# Patient Record
Sex: Male | Born: 1965 | Race: Black or African American | Hispanic: No | Marital: Married | Smoking: Never smoker
Health system: Southern US, Community
[De-identification: ages and names within clinical notes are randomized; demographics above are authoritative.]

## PROBLEM LIST (undated history)

## (undated) ENCOUNTER — Ambulatory Visit: Discharge status: Absent without leave | Payer: BLUE CROSS/BLUE SHIELD

## (undated) DIAGNOSIS — M1990 Unspecified osteoarthritis, unspecified site: Secondary | ICD-10-CM

## (undated) DIAGNOSIS — K219 Gastro-esophageal reflux disease without esophagitis: Secondary | ICD-10-CM

## (undated) DIAGNOSIS — R011 Cardiac murmur, unspecified: Secondary | ICD-10-CM

## (undated) DIAGNOSIS — S83209A Unspecified tear of unspecified meniscus, current injury, unspecified knee, initial encounter: Secondary | ICD-10-CM

## (undated) HISTORY — PX: TOE SURGERY: SHX1073

## (undated) HISTORY — DX: Cardiac murmur, unspecified: R01.1

## (undated) HISTORY — DX: Unspecified tear of unspecified meniscus, current injury, unspecified knee, initial encounter: S83.209A

## (undated) HISTORY — PX: SHOULDER SURGERY: SHX246

## (undated) HISTORY — PX: FACIAL LACERATION REPAIR: SHX6589

## (undated) HISTORY — PX: EXTERNAL EAR SURGERY: SHX627

---

## 1997-09-08 ENCOUNTER — Other Ambulatory Visit: Admission: RE | Admit: 1997-09-08 | Discharge: 1997-09-08 | Payer: Self-pay | Admitting: Obstetrics and Gynecology

## 2001-03-13 ENCOUNTER — Emergency Department (HOSPITAL_COMMUNITY): Admission: EM | Admit: 2001-03-13 | Discharge: 2001-03-13 | Payer: Self-pay

## 2004-05-19 ENCOUNTER — Emergency Department: Payer: Self-pay | Admitting: Emergency Medicine

## 2011-07-24 ENCOUNTER — Ambulatory Visit: Payer: Self-pay | Admitting: Family Medicine

## 2011-10-31 ENCOUNTER — Emergency Department (HOSPITAL_BASED_OUTPATIENT_CLINIC_OR_DEPARTMENT_OTHER): Payer: No Typology Code available for payment source

## 2011-10-31 ENCOUNTER — Emergency Department (HOSPITAL_BASED_OUTPATIENT_CLINIC_OR_DEPARTMENT_OTHER)
Admission: EM | Admit: 2011-10-31 | Discharge: 2011-10-31 | Disposition: A | Discharge status: Home / Self Care | Payer: No Typology Code available for payment source | Attending: Emergency Medicine | Admitting: Emergency Medicine

## 2011-10-31 ENCOUNTER — Encounter (HOSPITAL_BASED_OUTPATIENT_CLINIC_OR_DEPARTMENT_OTHER): Payer: Self-pay

## 2011-10-31 DIAGNOSIS — M542 Cervicalgia: Secondary | ICD-10-CM | POA: Insufficient documentation

## 2011-10-31 DIAGNOSIS — M549 Dorsalgia, unspecified: Secondary | ICD-10-CM | POA: Insufficient documentation

## 2011-10-31 MED ORDER — HYDROCODONE-ACETAMINOPHEN 5-500 MG PO TABS: 1.0000 | ORAL_TABLET | Freq: Four times a day (QID) | ORAL | Status: AC | PRN
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
## 2011-10-31 NOTE — Discharge Instructions (Signed)
Back Pain, Adult Low back pain is very common. About 1 in 5 people have back pain.The cause of low back pain is rarely dangerous. The pain often gets better over time.About half of people with a sudden onset of back pain feel better in just 2 weeks. About 8 in 10 people feel better by 6 weeks.  CAUSES Some common causes of back pain include:  Strain of the muscles or ligaments supporting the spine.   Wear and tear (degeneration) of the spinal discs.   Arthritis.   Direct injury to the back.  DIAGNOSIS Most of the time, the direct cause of low back pain is not known.However, back pain can be treated effectively even when the exact cause of the pain is unknown.Answering your caregiver's questions about your overall health and symptoms is one of the most accurate ways to make sure the cause of your pain is not dangerous. If your caregiver needs more information, he or she may order lab work or imaging tests (X-rays or MRIs).However, even if imaging tests show changes in your back, this usually does not require surgery. HOME CARE INSTRUCTIONS For many people, back pain returns.Since low back pain is rarely dangerous, it is often a condition that people can learn to manageon their own.   Remain active. It is stressful on the back to sit or stand in one place. Do not sit, drive, or stand in one place for more than 30 minutes at a time. Take short walks on level surfaces as soon as pain allows.Try to increase the length of time you walk each day.   Do not stay in bed.Resting more than 1 or 2 days can delay your recovery.   Do not avoid exercise or work.Your body is made to move.It is not dangerous to be active, even though your back may hurt.Your back will likely heal faster if you return to being active before your pain is gone.   Pay attention to your body when you bend and lift. Many people have less discomfortwhen lifting if they bend their knees, keep the load close to their  bodies,and avoid twisting. Often, the most comfortable positions are those that put less stress on your recovering back.   Find a comfortable position to sleep. Use a firm mattress and lie on your side with your knees slightly bent. If you lie on your back, put a pillow under your knees.   Only take over-the-counter or prescription medicines as directed by your caregiver. Over-the-counter medicines to reduce pain and inflammation are often the most helpful.Your caregiver may prescribe muscle relaxant drugs.These medicines help dull your pain so you can more quickly return to your normal activities and healthy exercise.   Put ice on the injured area.   Put ice in a plastic bag.   Place a towel between your skin and the bag.   Leave the ice on for 15 to 20 minutes, 3 to 4 times a day for the first 2 to 3 days. After that, ice and heat may be alternated to reduce pain and spasms.   Ask your caregiver about trying back exercises and gentle massage. This may be of some benefit.   Avoid feeling anxious or stressed.Stress increases muscle tension and can worsen back pain.It is important to recognize when you are anxious or stressed and learn ways to manage it.Exercise is a great option.  SEEK MEDICAL CARE IF:  You have pain that is not relieved with rest or medicine.   You have   pain that does not improve in 1 week.   You have new symptoms.   You are generally not feeling well.  SEEK IMMEDIATE MEDICAL CARE IF:   You have pain that radiates from your back into your legs.   You develop new bowel or bladder control problems.   You have unusual weakness or numbness in your arms or legs.   You develop nausea or vomiting.   You develop abdominal pain.   You feel faint.  Document Released: 05/15/2005 Document Revised: 05/04/2011 Document Reviewed: 10/03/2010 [employer] Patient Information 2012 [employer]. 
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
## 2011-11-06 NOTE — ED Provider Notes (Signed)
Medical screening examination/treatment/procedure(s) were performed by non-physician practitioner and as supervising physician I was immediately available for consultation/collaboration.    Tonyia Marschall L Zabria Liss, MD 11/06/11 1136 

## 2011-11-09 ENCOUNTER — Ambulatory Visit: Payer: Self-pay | Admitting: Family Medicine

## 2011-11-14 ENCOUNTER — Ambulatory Visit: Payer: Self-pay | Admitting: Family Medicine

## 2011-12-04 ENCOUNTER — Ambulatory Visit: Payer: No Typology Code available for payment source | Attending: Internal Medicine | Admitting: Physical Therapy

## 2011-12-04 DIAGNOSIS — IMO0001 Reserved for inherently not codable concepts without codable children: Secondary | ICD-10-CM | POA: Insufficient documentation

## 2011-12-04 DIAGNOSIS — M542 Cervicalgia: Secondary | ICD-10-CM | POA: Insufficient documentation

## 2011-12-04 DIAGNOSIS — M2569 Stiffness of other specified joint, not elsewhere classified: Secondary | ICD-10-CM | POA: Insufficient documentation

## 2011-12-04 DIAGNOSIS — M545 Low back pain, unspecified: Secondary | ICD-10-CM | POA: Insufficient documentation

## 2011-12-07 ENCOUNTER — Ambulatory Visit: Payer: No Typology Code available for payment source | Admitting: Physical Therapy

## 2011-12-11 ENCOUNTER — Encounter: Payer: No Typology Code available for payment source | Admitting: Physical Therapy

## 2011-12-13 ENCOUNTER — Ambulatory Visit: Payer: No Typology Code available for payment source | Admitting: Physical Therapy

## 2011-12-19 ENCOUNTER — Ambulatory Visit: Payer: No Typology Code available for payment source | Admitting: Physical Therapy

## 2011-12-21 ENCOUNTER — Ambulatory Visit: Payer: No Typology Code available for payment source | Admitting: Physical Therapy

## 2012-01-01 ENCOUNTER — Ambulatory Visit: Payer: No Typology Code available for payment source | Attending: Internal Medicine | Admitting: Physical Therapy

## 2012-01-01 DIAGNOSIS — M545 Low back pain, unspecified: Secondary | ICD-10-CM | POA: Insufficient documentation

## 2012-01-01 DIAGNOSIS — M542 Cervicalgia: Secondary | ICD-10-CM | POA: Insufficient documentation

## 2012-01-01 DIAGNOSIS — M2569 Stiffness of other specified joint, not elsewhere classified: Secondary | ICD-10-CM | POA: Insufficient documentation

## 2012-01-01 DIAGNOSIS — IMO0001 Reserved for inherently not codable concepts without codable children: Secondary | ICD-10-CM | POA: Insufficient documentation

## 2012-01-08 ENCOUNTER — Ambulatory Visit: Payer: No Typology Code available for payment source | Admitting: Physical Therapy

## 2012-01-10 ENCOUNTER — Ambulatory Visit: Payer: No Typology Code available for payment source | Admitting: Physical Therapy

## 2012-01-18 ENCOUNTER — Ambulatory Visit: Payer: No Typology Code available for payment source | Admitting: Physical Therapy

## 2014-08-26 ENCOUNTER — Ambulatory Visit
Admit: 2014-08-26 | Disposition: A | Discharge status: Home / Self Care | Payer: Self-pay | Attending: Family Medicine | Admitting: Family Medicine

## 2015-03-08 ENCOUNTER — Encounter (HOSPITAL_BASED_OUTPATIENT_CLINIC_OR_DEPARTMENT_OTHER): Payer: Self-pay | Admitting: *Deleted

## 2015-03-08 ENCOUNTER — Emergency Department (HOSPITAL_BASED_OUTPATIENT_CLINIC_OR_DEPARTMENT_OTHER)
Admission: EM | Admit: 2015-03-08 | Discharge: 2015-03-09 | Disposition: A | Discharge status: Home / Self Care | Payer: Managed Care, Other (non HMO) | Attending: Emergency Medicine | Admitting: Emergency Medicine

## 2015-03-08 ENCOUNTER — Emergency Department (HOSPITAL_BASED_OUTPATIENT_CLINIC_OR_DEPARTMENT_OTHER): Payer: Managed Care, Other (non HMO)

## 2015-03-08 DIAGNOSIS — S138XXA Sprain of joints and ligaments of other parts of neck, initial encounter: Secondary | ICD-10-CM | POA: Diagnosis not present

## 2015-03-08 DIAGNOSIS — S4992XA Unspecified injury of left shoulder and upper arm, initial encounter: Secondary | ICD-10-CM | POA: Insufficient documentation

## 2015-03-08 DIAGNOSIS — Y92410 Unspecified street and highway as the place of occurrence of the external cause: Secondary | ICD-10-CM | POA: Diagnosis not present

## 2015-03-08 DIAGNOSIS — S0990XA Unspecified injury of head, initial encounter: Secondary | ICD-10-CM | POA: Diagnosis not present

## 2015-03-08 DIAGNOSIS — Y998 Other external cause status: Secondary | ICD-10-CM | POA: Insufficient documentation

## 2015-03-08 DIAGNOSIS — S3991XA Unspecified injury of abdomen, initial encounter: Secondary | ICD-10-CM | POA: Diagnosis not present

## 2015-03-08 DIAGNOSIS — S199XXA Unspecified injury of neck, initial encounter: Secondary | ICD-10-CM | POA: Diagnosis present

## 2015-03-08 DIAGNOSIS — S3992XA Unspecified injury of lower back, initial encounter: Secondary | ICD-10-CM | POA: Diagnosis not present

## 2015-03-08 DIAGNOSIS — Y9389 Activity, other specified: Secondary | ICD-10-CM | POA: Diagnosis not present

## 2015-03-08 DIAGNOSIS — S139XXA Sprain of joints and ligaments of unspecified parts of neck, initial encounter: Secondary | ICD-10-CM

## 2015-03-08 LAB — CBC WITH DIFFERENTIAL/PLATELET
BASOS ABS: 0 10*3/uL (ref 0.0–0.1)
BASOS PCT: 0 %
EOS PCT: 5 %
Eosinophils Absolute: 0.2 10*3/uL (ref 0.0–0.7)
HCT: 41.3 % (ref 39.0–52.0)
Hemoglobin: 13.4 g/dL (ref 13.0–17.0)
Lymphocytes Relative: 36 %
Lymphs Abs: 1.7 10*3/uL (ref 0.7–4.0)
MCH: 28.4 pg (ref 26.0–34.0)
MCHC: 32.4 g/dL (ref 30.0–36.0)
MCV: 87.5 fL (ref 78.0–100.0)
MONO ABS: 0.3 10*3/uL (ref 0.1–1.0)
Monocytes Relative: 7 %
Neutro Abs: 2.5 10*3/uL (ref 1.7–7.7)
Neutrophils Relative %: 52 %
PLATELETS: 214 10*3/uL (ref 150–400)
RBC: 4.72 MIL/uL (ref 4.22–5.81)
RDW: 13 % (ref 11.5–15.5)
WBC: 4.7 10*3/uL (ref 4.0–10.5)

## 2015-03-08 LAB — COMPREHENSIVE METABOLIC PANEL
ALBUMIN: 4 g/dL (ref 3.5–5.0)
ALT: 14 U/L — ABNORMAL LOW (ref 17–63)
AST: 22 U/L (ref 15–41)
Alkaline Phosphatase: 69 U/L (ref 38–126)
Anion gap: 3 — ABNORMAL LOW (ref 5–15)
BUN: 14 mg/dL (ref 6–20)
CHLORIDE: 104 mmol/L (ref 101–111)
CO2: 30 mmol/L (ref 22–32)
Calcium: 9 mg/dL (ref 8.9–10.3)
Creatinine, Ser: 1.03 mg/dL (ref 0.61–1.24)
GFR calc Af Amer: >60 mL/min (ref >60)
Glucose, Bld: 110 mg/dL — ABNORMAL HIGH (ref 65–99)
POTASSIUM: 3.6 mmol/L (ref 3.5–5.1)
Sodium: 137 mmol/L (ref 135–145)
Total Bilirubin: 2.4 mg/dL — ABNORMAL HIGH (ref 0.3–1.2)
Total Protein: 6.7 g/dL (ref 6.5–8.1)

## 2015-03-08 MED ORDER — DIAZEPAM 5 MG PO TABS
5.0000 mg | ORAL_TABLET | Freq: Once | ORAL | Status: AC
  Administered 2015-03-09: 5 mg via ORAL
  Filled 2015-03-08: qty 1

## 2015-03-08 MED ORDER — SODIUM CHLORIDE 0.9 % IV BOLUS (SEPSIS)
1000.0000 mL | Freq: Once | INTRAVENOUS | Status: AC
  Administered 2015-03-09: 1000 mL via INTRAVENOUS

## 2015-03-08 MED ORDER — IBUPROFEN 800 MG PO TABS
800.0000 mg | ORAL_TABLET | Freq: Once | ORAL | Status: AC
  Administered 2015-03-09: 800 mg via ORAL
  Filled 2015-03-08: qty 1

## 2015-03-08 MED ORDER — SODIUM CHLORIDE 0.9 % IV SOLN: Freq: Once | INTRAVENOUS | Status: DC

## 2015-03-08 MED ORDER — IOHEXOL 300 MG/ML  SOLN
100.0000 mL | Freq: Once | INTRAMUSCULAR | Status: AC | PRN
  Administered 2015-03-08: 100 mL via INTRAVENOUS
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
## 2015-03-09 MED ORDER — DIAZEPAM 5 MG PO TABS: 5.0000 mg | ORAL_TABLET | Freq: Four times a day (QID) | ORAL | Status: DC | PRN
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
## 2015-03-30 ENCOUNTER — Encounter: Payer: Self-pay | Admitting: Physical Therapy

## 2015-03-30 ENCOUNTER — Ambulatory Visit: Payer: Self-pay | Attending: Internal Medicine | Admitting: Physical Therapy

## 2015-03-30 DIAGNOSIS — M545 Low back pain, unspecified: Secondary | ICD-10-CM

## 2015-03-30 DIAGNOSIS — M546 Pain in thoracic spine: Secondary | ICD-10-CM | POA: Insufficient documentation

## 2015-03-30 DIAGNOSIS — M25612 Stiffness of left shoulder, not elsewhere classified: Secondary | ICD-10-CM | POA: Insufficient documentation
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
## 2015-03-30 NOTE — Patient Instructions (Signed)
AROM: Lateral Neck Flexion   Slowly tilt head toward one shoulder, then the other. Hold each position _3__ seconds. Repeat __10__ times per set. Do _2___ sets per session. Do _2___ sessions per day.  AROM: Neck Flexion   Bend head forward. Hold _3___ seconds. Repeat _10___ times per set. Do _2___ sets per session. Do _2___ sessions per day.  Levator Stretch   Grasp seat or sit on hand on side to be stretched. Turn head toward other side and look down. Use hand on head to gently stretch neck in that position. Hold _20___ seconds. Repeat on other side. Repeat __3__ times. Do __2__ sessions per day.  Elevation / Depression   While slowly inhaling, bring shoulders toward ears. Hold _2__ seconds. Slowly exhale while relaxing shoulders backward and downward. Repeat _10__ times. Do _2__ times per day.SCAPULA: Retraction   Hold cane with both hands. Pinch shoulder blades together. Do not shrug shoulders. Hold ___ seconds. Use___ lb weight on cane. ___ reps per set, ___ sets per day, ___ days per week  NECK TENSION: Assisted Stretch   Reach right arm around head and hold slightly above ear. Gently bring right ear toward right shoulder. Hold position for ___ breaths. Repeat with other arm. Repeat ___ times, alternating arms. Do ___ times per day.  Flexibility: Neck Retraction   Pull head straight back, keeping eyes and jaw level. Repeat ____ times per set. Do ____ sets per session. Do ____ sessions per day.   Knee-to-Chest: with Neck Flexion Stretch (Supine)   Pull left knee to chest, tucking chin and lifting head. Hold __10__ seconds. Relax. Repeat _10___ times per set. Do _2___ sets per session. Do __2__ sessions per day.  Trunk: Knees to Chest   Lie on firm, flat surface. Keep head and shoulders flat on surface. Tuck hands behind knees and pull to chest. Hold _10___ seconds. Repeat __10__ times. Do _2___ sessions per day. CAUTION: Movement should be gentle and  slow.  Caudal Rotation: Hip Roll, Neutral Lordosis - Supine   Lie with knees bent and slightly elevated, feet flat. Tighten stomach, lower knees out to right side, rotating hips and trunk. Keep stomach tight for return. Repeat _10___ times per set. Do __2__ sets per session. Do _2___ sessions per week.  Pelvic Tilt: Anterior - Legs Bent (Supine)   Rotate pelvis up and arch back. Hold ____ seconds. Relax. Repeat ____ times per set. Do ____ sets per session. Do ____ sessions per day.  Piriformis Stretch   Lying on back, pull right knee toward opposite shoulder. Hold __30__ seconds. Repeat _4___ times. Do _2___ sessions per day.  

## 2015-04-01 ENCOUNTER — Ambulatory Visit: Payer: Self-pay | Admitting: Physical Therapy

## 2015-04-01 ENCOUNTER — Encounter: Payer: Self-pay | Admitting: Physical Therapy

## 2015-04-01 DIAGNOSIS — M25612 Stiffness of left shoulder, not elsewhere classified: Secondary | ICD-10-CM

## 2015-04-01 DIAGNOSIS — M545 Low back pain, unspecified: Secondary | ICD-10-CM

## 2015-04-01 DIAGNOSIS — M546 Pain in thoracic spine: Secondary | ICD-10-CM

## 2015-04-01 NOTE — Therapy (Signed)
[facility] [facility] [address] [place], [place], [address] Phone: [phone]   Fax:  [phone]  Physical Therapy Treatment  Patient Details  Name: Kenneth Bullock MRN: [id number] Date of Birth: 02-22-1966 Referring Provider: Kirby Funk  Encounter Date: 04/01/2015      PT End of Session - 04/01/15 1648    Visit Number 2   PT Start Time 1601   PT Stop Time 1655   PT Time Calculation (min) 54 min      History reviewed. No pertinent past medical history.  Past Surgical History  Procedure Laterality Date  . Toe surgery      There were no vitals filed for this visit.  Visit Diagnosis:  Bilateral low back pain without sciatica  Bilateral thoracic back pain  Shoulder stiffness, left      Subjective Assessment - 04/01/15 1605    Subjective Pt reports that his back and shoulder pain have increased a little since last visit. He has really been dealing with a lot of spasms in his low back pain, and pain in the joint at the shoulder.   Pain Score 7    Pain Location Back   Pain Orientation Left;Lower   Pain Descriptors / Indicators Aching;Spasm                         [facility] Adult PT Treatment/Exercise - 04/01/15 0001    Exercises   Exercises Shoulder;Lumbar   Lumbar Exercises: Machines for Strengthening   Leg Press 40# 2x 10   Other Lumbar Machine Exercise 10# Extension and abduction  2x 10 Both LE's   Lumbar Exercises: Seated   Other Seated Lumbar Exercises dyna disc lumbar CC, CCW,  flexion and extension    Shoulder Exercises: ROM/Strengthening   Cybex Row 10 reps   Cybex Row Limitations 25# 2x 10, Lat pull downs 25# 2x10   Modalities   Modalities Moist Heat;Electrical Stimulation   Moist Heat Therapy   Number Minutes Moist Heat 15 Minutes   Moist Heat Location Lumbar Spine;Shoulder   Electrical Stimulation   Electrical Stimulation Location Lumbar and Shoulder   Electrical Stimulation  Action Premod   Electrical Stimulation Parameters tolerance    Electrical Stimulation Goals Pain                PT Education - 04/01/15 1647    Education provided Yes   Education Details Importance of stretching    Person(s) Educated Patient   Methods Explanation   Comprehension Verbalized understanding          PT Short Term Goals - 03/30/15 1548    PT SHORT TERM GOAL #1   Title independent with initial HEP   Time 2   Period Weeks   Status New           PT Long Term Goals - 03/30/15 1548    PT LONG TERM GOAL #1   Title understand proper posture and body mechanics   Time 8   Period Weeks   Status New   PT LONG TERM GOAL #2   Title decrease pain 50%   Time 8   Period Weeks   Status New   PT LONG TERM GOAL #3   Title increase lumbar ROM 50%   Time 8   Period Weeks   Status New   PT LONG TERM GOAL #4   Title increase left shoulder ROM to 140 degrees flexion  Time 8   Period Weeks   Status New               Plan - 04/01/15 1650    Clinical Impression Statement Pt able to tolerate standing hip exercises and lumbar stabilization with c/o of slight increase in pain. Attempted pre-mod E-stim to shoulder and low back, because shoulder pain seemed to be most bothersome to pt.   PT Next Visit Plan Slowly add more lumbar and scapular strengthening exercises.         Problem List There are no active problems to display for this patient.   Meyer RusselMaegan Chavez Rosol, SPTA 04/01/2015, 4:55 PM  [facility] [address] [place], [place], [address] Phone: [phone]   Fax:  [phone]  Name: Kenneth Bullock MRN: [id number] Date of Birth: [birth year]

## 2015-04-05 ENCOUNTER — Ambulatory Visit: Payer: Self-pay | Admitting: Physical Therapy

## 2015-04-05 ENCOUNTER — Encounter: Payer: Self-pay | Admitting: Physical Therapy

## 2015-04-05 DIAGNOSIS — M25612 Stiffness of left shoulder, not elsewhere classified: Secondary | ICD-10-CM

## 2015-04-05 DIAGNOSIS — M546 Pain in thoracic spine: Secondary | ICD-10-CM

## 2015-04-05 DIAGNOSIS — M545 Low back pain, unspecified: Secondary | ICD-10-CM

## 2015-04-05 NOTE — Therapy (Signed)
[address] [address] [place], [place], [address] Phone: [phone]   Fax:  [phone]  Physical Therapy Treatment  Patient Details  Name: Kenneth Bullock MRN: [id number] Date of Birth: 26-Sep-1965 Referring Provider: Lavone Orn  Encounter Date: 04/05/2015      PT End of Session - 04/05/15 1645    Visit Number 3   Date for PT Re-Evaluation 05/30/15   PT Start Time 3710   PT Stop Time 1659   PT Time Calculation (min) 61 min   Activity Tolerance Patient tolerated treatment well   Behavior During Therapy [facility] for tasks assessed/performed      History reviewed. No pertinent past medical history.  Past Surgical History  Procedure Laterality Date  . Toe surgery      There were no vitals filed for this visit.  Visit Diagnosis:  Shoulder stiffness, left  Bilateral thoracic back pain  Bilateral low back pain without sciatica      Subjective Assessment - 04/05/15 1601    Subjective Pt reports that he continues to have spasms in the back and L shoulder   Diagnostic tests x-rays negative   Currently in Pain? Yes   Pain Score 5    Pain Location Back   Pain Orientation Lower;Mid   Pain Descriptors / Indicators Aching;Spasm                         [facility] Adult PT Treatment/Exercise - 04/05/15 0001    Lumbar Exercises: Stretches   Passive Hamstring Stretch 4 reps;10 seconds   Single Knee to Chest Stretch 1 rep;10 seconds   Double Knee to Chest Stretch 2 reps;10 seconds   Lower Trunk Rotation 2 reps;10 seconds   Lumbar Exercises: Aerobic   Elliptical I 10 R 5 23fd/2rev   UBE (Upper Arm Bike) NuStep L5 x8   Lumbar Exercises: Machines for Strengthening   Cybex Knee Extension #25 2x15    Cybex Knee Flexion #35 2x15    Leg Press 50# 2x 15   Lumbar Exercises: Standing   Other Standing Lumbar Exercises Standing straight arm pull downs #35 2x15    Lumbar Exercises: Seated   Sit to Stand 10  reps  2 sets, OHP with blue weighted ball    Other Seated Lumbar Exercises Seated obliques blue weighted ball LE elevated 2x10    Lumbar Exercises: Supine   Bridge 2 seconds;20 reps  ab set    Shoulder Exercises: Seated   Row 15 reps  2 sets    Row Weight (lbs) 45lb   Other Seated Exercises Seated Lat pull downs 2x15 #35    Modalities   Modalities Moist Heat;Electrical Stimulation   Moist Heat Therapy   Number Minutes Moist Heat 15 Minutes   Moist Heat Location Lumbar Spine;Shoulder   Electrical Stimulation   Electrical Stimulation Location Lumbar and Shoulder   Electrical Stimulation Action Premod    Electrical Stimulation Parameters tolerance    Electrical Stimulation Goals Pain                  PT Short Term Goals - 04/05/15 1648    PT SHORT TERM GOAL #1   Title independent with initial HEP   Status Achieved           PT Long Term Goals - 04/05/15 1649    PT LONG TERM GOAL #1   Title understand proper posture and body mechanics   PT  LONG TERM GOAL #2   Title decrease pain 50%   Status Partially Met               Plan - 04/05/15 1647    Clinical Impression Statement Pt able to progress with interventions with increased weight/resistance. Able to perform more core interventions with only slight increase in LBP. Pt does report improvement overall.   Pt will benefit from skilled therapeutic intervention in order to improve on the following deficits Decreased strength;Decreased range of motion;Impaired flexibility;Increased muscle spasms;Pain   Rehab Potential Good   PT Frequency 2x / week   PT Duration 8 weeks   PT Treatment/Interventions Moist Heat;Electrical Stimulation;ADLs/Self Care Home Management;Therapeutic activities;Therapeutic exercise;Manual techniques;Patient/family education   PT Next Visit Plan Slowly add more lumbar and scapular strengthening exercises.         Problem List There are no active problems to display for this  patient.   Scot Jun, PTA  04/05/2015, 4:49 PM  [place], [place], [address] Phone: [phone]   Fax:  [phone]  Name: Kenneth Bullock MRN: [id number] Date of Birth: 16-Nov-1965

## 2015-04-08 ENCOUNTER — Encounter: Payer: Self-pay | Admitting: Physical Therapy

## 2015-04-08 ENCOUNTER — Ambulatory Visit: Payer: Self-pay | Admitting: Physical Therapy

## 2015-04-08 DIAGNOSIS — M546 Pain in thoracic spine: Secondary | ICD-10-CM

## 2015-04-08 DIAGNOSIS — M545 Low back pain, unspecified: Secondary | ICD-10-CM

## 2015-04-08 DIAGNOSIS — M25612 Stiffness of left shoulder, not elsewhere classified: Secondary | ICD-10-CM

## 2015-04-08 NOTE — Therapy (Signed)
[place] [address] [place], [place], [address] Phone: [phone]   Fax:  [phone]  Physical Therapy Treatment  Patient Details  Name: Kenneth Bullock MRN: [id number] Date of Birth: 04-17-1966 Referring Provider: Lavone Orn  Encounter Date: 04/08/2015      PT End of Session - 04/08/15 1644    Visit Number 4   Date for PT Re-Evaluation 05/30/15   PT Start Time 1603   PT Stop Time 1642   PT Time Calculation (min) 39 min      History reviewed. No pertinent past medical history.  Past Surgical History  Procedure Laterality Date  . Toe surgery      There were no vitals filed for this visit.  Visit Diagnosis:  Bilateral thoracic back pain  Bilateral low back pain without sciatica  Shoulder stiffness, left      Subjective Assessment - 04/08/15 1602    Subjective Pt reports some workout soreness after last treatment. Shoulder feel the same but his back feels better   Currently in Pain? Yes   Pain Score 5    Pain Location Back   Pain Orientation Lower;Mid                         [facility] Adult PT Treatment/Exercise - 04/08/15 0001    Lumbar Exercises: Aerobic   UBE (Upper Arm Bike) NuStep L5 x8   Lumbar Exercises: Machines for Strengthening   Cybex Knee Extension #25 2x15    Cybex Knee Flexion #35 2x15    Leg Press 60# 2x 15   Other Lumbar Machine Exercise 10# Extension and abduction  x10 Both LE's   Lumbar Exercises: Standing   Other Standing Lumbar Exercises Standing straight arm pull downs #45 2x15, Standing rev grip rows #45 2x15    Other Standing Lumbar Exercises Standinmg AR press #25 x10   Lumbar Exercises: Seated   Sit to Stand 10 reps  2 sets, OHP with blue weighted ball.    Other Seated Lumbar Exercises Seated obliques blue weighted ball LE elevateed 2x10    Shoulder Exercises: Seated   Row 15 reps  2 sets    Row Weight (lbs) 45lb   Other Seated Exercises Seated  Lat pull downs 2x15 #35                   PT Short Term Goals - 04/05/15 1648    PT SHORT TERM GOAL #1   Title independent with initial HEP   Status Achieved           PT Long Term Goals - 04/05/15 1649    PT LONG TERM GOAL #1   Title understand proper posture and body mechanics   PT LONG TERM GOAL #2   Title decrease pain 50%   Status Partially Met               Plan - 04/08/15 1645    Clinical Impression Statement Pt performed well in PT this date able to complete all interventions and shows good strength. Does have a mid back spasm with standing AR press intervention. Reports that his back has improved but he still has shoulder pain.    Pt will benefit from skilled therapeutic intervention in order to improve on the following deficits Decreased strength;Decreased range of motion;Impaired flexibility;Increased muscle spasms;Pain   Rehab Potential Good   PT Frequency 2x / week   PT Duration 8  weeks   PT Treatment/Interventions Moist Heat;Electrical Stimulation;ADLs/Self Care Home Management;Therapeutic activities;Therapeutic exercise;Manual techniques;Patient/family education   PT Next Visit Plan Core strengthening         Problem List There are no active problems to display for this patient.   Scot Jun, PTA 04/08/2015, 4:49 PM  [place] [address] [place], [place], [address] Phone: [phone]   Fax:  [phone]  Name: Kenneth Bullock MRN: [id number] Date of Birth: Oct 07, 1965

## 2015-04-12 ENCOUNTER — Ambulatory Visit: Payer: Self-pay | Admitting: Physical Therapy

## 2015-04-12 ENCOUNTER — Encounter: Payer: Self-pay | Admitting: Physical Therapy

## 2015-04-12 DIAGNOSIS — M546 Pain in thoracic spine: Secondary | ICD-10-CM

## 2015-04-12 DIAGNOSIS — M545 Low back pain, unspecified: Secondary | ICD-10-CM

## 2015-04-12 DIAGNOSIS — M25612 Stiffness of left shoulder, not elsewhere classified: Secondary | ICD-10-CM

## 2015-04-12 NOTE — Therapy (Signed)
[address] [place], [place], [address] Phone: [phone]   Fax:  [phone]  Physical Therapy Treatment  Patient Details  Name: Kenneth Bullock MRN: [id number] Date of Birth: 06-Jul-1965 Referring Provider: Lavone Orn  Encounter Date: 04/12/2015      PT End of Session - 04/12/15 1652    Visit Number 5   Date for PT Re-Evaluation 05/30/15   PT Start Time 6962   PT Stop Time 1703   PT Time Calculation (min) 50 min      History reviewed. No pertinent past medical history.  Past Surgical History  Procedure Laterality Date  . Toe surgery      There were no vitals filed for this visit.  Visit Diagnosis:  Bilateral thoracic back pain  Bilateral low back pain without sciatica  Shoulder stiffness, left      Subjective Assessment - 04/12/15 1614    Subjective Pt reports back pain is decreasing but shoulder pain remains constant. Pt states he feels he is getting stronger and slowly improving.    Currently in Pain? Yes   Pain Score 4    Pain Location Back   Pain Orientation Lower;Mid   Pain Type Acute pain   Pain Onset 1 to 4 weeks ago                         [facility] Adult PT Treatment/Exercise - 04/12/15 0001    Lumbar Exercises: Aerobic   Elliptical I 10 R 5 80frd/2rev   UBE (Upper Arm Bike) NuStep L5 x8   Lumbar Exercises: Machines for Strengthening   Cybex Knee Extension #25 2x15    Cybex Knee Flexion #35 2x15    Leg Press 60# 2x 15   Other Lumbar Machine Exercise 10# Extension and abduction  x10 Both LE's   Lumbar Exercises: Standing   Other Standing Lumbar Exercises Standing straigh arm pull downs #45 2x15, Standing rev grip rowx #45 2x15    Other Standing Lumbar Exercises Standinmg AR press #25 x10   Modalities   Modalities Moist Heat;Electrical Stimulation   Moist Heat Therapy   Number Minutes Moist Heat 15 Minutes   Moist Heat Location Lumbar Spine;Shoulder   Electrical Stimulation   Electrical Stimulation Location Lumbar and Shoulder   Electrical Stimulation Action Premod   Electrical Stimulation Parameters tolerance   Electrical Stimulation Goals Pain                  PT Short Term Goals - 04/05/15 1648    PT SHORT TERM GOAL #1   Title independent with initial HEP   Status Achieved           PT Long Term Goals - 04/05/15 1649    PT LONG TERM GOAL #1   Title understand proper posture and body mechanics   PT LONG TERM GOAL #2   Title decrease pain 50%   Status Partially Met               Plan - 04/12/15 1653    Clinical Impression Statement Pt arrived almost 15 minutes late to therapy. Pt complains of mild spasms with activities that require lumbar rotation. Complains of pain in shoulder with lifting activities. Strength continues to improve, reports of pain steadilly decreasing.    PT Next Visit Plan Work to add core strengthening and shoulder resistance exercises.         Problem List There  are no active problems to display for this patient.   Crist Fat, SPTA 04/12/2015, 4:56 PM  [place] [address] [place], [place], [address] Phone: [phone]   Fax:  [phone]  Name: Kenneth Bullock MRN: [id number] Date of Birth: [birth year]

## 2015-04-15 ENCOUNTER — Ambulatory Visit: Payer: Self-pay | Admitting: Physical Therapy

## 2015-04-20 ENCOUNTER — Ambulatory Visit: Payer: Self-pay | Admitting: Physical Therapy

## 2015-04-20 ENCOUNTER — Encounter: Payer: Self-pay | Admitting: Physical Therapy

## 2015-04-20 DIAGNOSIS — M25612 Stiffness of left shoulder, not elsewhere classified: Secondary | ICD-10-CM

## 2015-04-20 DIAGNOSIS — M545 Low back pain, unspecified: Secondary | ICD-10-CM

## 2015-04-20 DIAGNOSIS — M546 Pain in thoracic spine: Secondary | ICD-10-CM

## 2015-04-20 NOTE — Therapy (Signed)
[address] [place], [place], [address] Phone: [phone]   Fax:  [phone]  Physical Therapy Treatment  Patient Details  Name: Kenneth Bullock MRN: [id number] Date of Birth: September 12, 1965 Referring Provider: Lavone Orn  Encounter Date: 04/20/2015      PT End of Session - 04/20/15 1643    Visit Number 6   Date for PT Re-Evaluation 05/30/15   PT Start Time 1602   PT Stop Time 1642   PT Time Calculation (min) 40 min      History reviewed. No pertinent past medical history.  Past Surgical History  Procedure Laterality Date  . Toe surgery      There were no vitals filed for this visit.  Visit Diagnosis:  Bilateral low back pain without sciatica  Shoulder stiffness, left  Bilateral thoracic back pain      Subjective Assessment - 04/20/15 1608    Subjective Pt reports that his shoulder is still weak, but ROM is better. Continues to have low back spasms    Currently in Pain? No/denies   Pain Score 4    Pain Location Back  Shoulder 6/10            [facility] PT Assessment - 04/20/15 0001    AROM   Overall AROM Comments Lumbar ROM WNL                     [facility] Adult PT Treatment/Exercise - 04/20/15 0001    Lumbar Exercises: Aerobic   Elliptical I 15 R 10 82frd/3rev   Lumbar Exercises: Machines for Strengthening   Cybex Knee Extension #35 2x15    Cybex Knee Flexion #45 2x15    Leg Press 70# 2x 15   Lumbar Exercises: Standing   Other Standing Lumbar Exercises Standing straight arm pull downs #45 2x15, Standing rev grip rows #45 2x15    Other Standing Lumbar Exercises Standing AR press #35 x10, RDL #9 2x10; Resisted side step and squat blue tband frd reach blue weights ball 3x5    Shoulder Exercises: Seated   Other Seated Exercises Seated Lat pull downs 2x15 #35                   PT Short Term Goals - 04/05/15 1648    PT SHORT TERM GOAL #1   Title independent  with initial HEP   Status Achieved           PT Long Term Goals - 04/20/15 1646    PT LONG TERM GOAL #1   Title understand proper posture and body mechanics   Status Partially Met   PT LONG TERM GOAL #3   Title increase lumbar ROM 50%   Status Achieved               Plan - 04/20/15 1645    Clinical Impression Statement Pt performed well in therapy this date. Reports low back spasms at time with activity. Shoulder ROM has improved with more function. Tolerated multi plan interventions this date.    Pt will benefit from skilled therapeutic intervention in order to improve on the following deficits Decreased strength;Decreased range of motion;Impaired flexibility;Increased muscle spasms;Pain   PT Frequency 2x / week   PT Duration 8 weeks   PT Treatment/Interventions Moist Heat;Electrical Stimulation;ADLs/Self Care Home Management;Therapeutic activities;Therapeutic exercise;Manual techniques;Patient/family education   PT Next Visit Plan Planks         Problem List There are no  active problems to display for this patient.   Scot Jun, PTA  04/20/2015, 4:47 PM  [place] [address] [place], [place], [address] Phone: [phone]   Fax:  [phone]  Name: Kenneth Bullock MRN: [id number] Date of Birth: June 15, 1965

## 2015-04-26 ENCOUNTER — Encounter: Payer: Self-pay | Admitting: Physical Therapy

## 2015-04-26 ENCOUNTER — Ambulatory Visit: Payer: Self-pay | Admitting: Physical Therapy

## 2015-04-26 DIAGNOSIS — M545 Low back pain, unspecified: Secondary | ICD-10-CM

## 2015-04-26 DIAGNOSIS — M546 Pain in thoracic spine: Secondary | ICD-10-CM

## 2015-04-26 DIAGNOSIS — M25612 Stiffness of left shoulder, not elsewhere classified: Secondary | ICD-10-CM

## 2015-04-26 NOTE — Therapy (Signed)
[address] [address] [place], [place], [address] Phone: [phone]   Fax:  [phone]  Physical Therapy Treatment  Patient Details  Name: Kenneth Bullock MRN: [id number] Date of Birth: 30-Dec-1965 Referring Provider: Lavone Orn  Encounter Date: 04/26/2015      PT End of Session - 04/26/15 1654    Visit Number 7   Date for PT Re-Evaluation 05/30/15   PT Start Time 1623   PT Stop Time 1705   PT Time Calculation (min) 42 min      History reviewed. No pertinent past medical history.  Past Surgical History  Procedure Laterality Date  . Toe surgery      There were no vitals filed for this visit.  Visit Diagnosis:  Bilateral low back pain without sciatica  Shoulder stiffness, left  Bilateral thoracic back pain      Subjective Assessment - 04/26/15 1625    Subjective Pt reports back has been feeling better overall, spaspms are less frequent. Reports work activities irritate it most.    Currently in Pain? Yes   Pain Score 3    Pain Location Back   Pain Orientation Lower   Pain Descriptors / Indicators Aching;Spasm   Pain Type Acute pain   Pain Onset 1 to 4 weeks ago   Pain Frequency Intermittent   Aggravating Factors  lifting at work    Pain Relieving Factors exercise and activity                          [facility] Adult PT Treatment/Exercise - 04/26/15 0001    Lumbar Exercises: Stretches   Press Ups 2 reps;30 seconds   Lumbar Exercises: Standing   Other Standing Lumbar Exercises standing lumbar extension 35# 2x10   Lumbar Exercises: Supine   Bridge 10 reps;2 seconds  2 sets    Other Supine Lumbar Exercises bridge with ball roll, and UE weight red ball    Lumbar Exercises: Prone   Other Prone Lumbar Exercises plank 15 sec x 5    Shoulder Exercises: Seated   Row 15 reps  2 sets    Row Weight (lbs) 45lb   Other Seated Exercises Seated Lat pull downs 2x15 #35    Modalities   Modalities Moist Heat;Electrical Stimulation   Moist Heat Therapy   Number Minutes Moist Heat 15 Minutes   Moist Heat Location Lumbar Spine;Shoulder   Electrical Stimulation   Electrical Stimulation Location Lumbar and Shoulder   Electrical Stimulation Action Premod   Electrical Stimulation Parameters tolerance   Electrical Stimulation Goals Pain                PT Education - 04/26/15 1654    Education Details Educated pt on HEP compliance    Person(s) Educated Patient   Methods Explanation   Comprehension Verbalized understanding          PT Short Term Goals - 04/05/15 1648    PT SHORT TERM GOAL #1   Title independent with initial HEP   Status Achieved           PT Long Term Goals - 04/20/15 1646    PT LONG TERM GOAL #1   Title understand proper posture and body mechanics   Status Partially Met   PT LONG TERM GOAL #3   Title increase lumbar ROM 50%   Status Achieved  Plan - 04/26/15 1655    Clinical Impression Statement Pt arrived late for therapy on this date. Able to complete all abdominal, oblique and lumbar stabilization exercises without report of increased pain. Pt reported non compliance with HEP  exercises and stretches, emphasized importance to pt.    PT Next Visit Plan Work on lumbar stabilization and core exercises.         Problem List There are no active problems to display for this patient.   Crist Fat, SPTA 04/26/2015, 4:57 PM  [place] [address] [place], [place], [address] Phone: [phone]   Fax:  [phone]  Name: Kenneth Bullock MRN: [id number] Date of Birth: 1965-06-01

## 2015-04-29 ENCOUNTER — Encounter: Payer: Self-pay | Admitting: Physical Therapy

## 2015-04-29 ENCOUNTER — Ambulatory Visit: Payer: Self-pay | Attending: Internal Medicine | Admitting: Physical Therapy

## 2015-04-29 DIAGNOSIS — M25612 Stiffness of left shoulder, not elsewhere classified: Secondary | ICD-10-CM

## 2015-04-29 DIAGNOSIS — M545 Low back pain, unspecified: Secondary | ICD-10-CM

## 2015-04-29 DIAGNOSIS — M546 Pain in thoracic spine: Secondary | ICD-10-CM

## 2015-04-29 NOTE — Therapy (Signed)
[address] [place], [place], [address] Phone: [phone]   Fax:  [phone]  Physical Therapy Treatment  Patient Details  Name: Kenneth Bullock MRN: [id number] Date of Birth: 1966-01-03 Referring Provider: Lavone Orn  Encounter Date: 04/29/2015      PT End of Session - 04/29/15 1614    Visit Number 8   Date for PT Re-Evaluation 05/30/15   PT Start Time 9449   PT Stop Time 1630   PT Time Calculation (min) 51 min      History reviewed. No pertinent past medical history.  Past Surgical History  Procedure Laterality Date  . Toe surgery      There were no vitals filed for this visit.  Visit Diagnosis:  Bilateral low back pain without sciatica  Shoulder stiffness, left  Bilateral thoracic back pain      Subjective Assessment - 04/29/15 1630    Subjective -- Pt reports he has not had any spasms since his last visit. He reports a strong catch in his right low back that sends a shooting pain down his right buttocks.    Currently in Pain? -- Yes    Pain Score --3   Pain Location -- Back, Lower, Right    Pain Orientation --   Pain Type --Shooting , Stabbing    Pain Onset --1 to 4 weeks    Pain Frequency --                          [facility] Adult PT Treatment/Exercise - 04/29/15 0001    Lumbar Exercises: Aerobic   Elliptical I 15 R 10 62fd/3rev   Lumbar Exercises: Seated   Other Seated Lumbar Exercises Seated obliques blue weighted ball LE elevateed 2x10    Lumbar Exercises: Supine   Bridge 10 reps  2 sets, with hamsting curl on green ball    Lumbar Exercises: Prone   Other Prone Lumbar Exercises quadraped alternating arms and legs    Other Prone Lumbar Exercises press ups 30 sec hold x 3    Modalities   Modalities Moist Heat;Electrical Stimulation;Iontophoresis   Moist Heat Therapy   Number Minutes Moist Heat 15 Minutes   Electrical Stimulation   Electrical Stimulation  Location Lumbar   Electrical Stimulation Action Premod   Electrical Stimulation Parameters tolerance   Electrical Stimulation Goals Pain   Iontophoresis   Type of Iontophoresis Dexamethasone   Location R low back    Dose 80 mA   Time 4 hours    Manual Therapy   Manual Therapy Myofascial release;Soft tissue mobilization   Soft tissue mobilization trigger point Low Bak                   PT Short Term Goals - 04/05/15 1648    PT SHORT TERM GOAL #1   Title independent with initial HEP   Status Achieved           PT Long Term Goals - 04/29/15 1616    PT LONG TERM GOAL #1   Title understand proper posture and body mechanics   Status Partially Met   PT LONG TERM GOAL #2   Title decrease pain 50%   Status Partially Met               Plan - 04/29/15 1615    Clinical Impression Statement Pt presented large nodule possible trigger point in right lower back.  Focused on lumbar and core exercises.    PT Next Visit Plan Continue to work on lumbar and core         Problem List There are no active problems to display for this patient.   Crist Fat 04/29/2015, 4:41 PM  [address] [place], [place], [address] Phone: [phone]   Fax:  [phone]  Name: Kenneth Bullock MRN: [id number] Date of Birth: 1965/06/16

## 2015-05-04 ENCOUNTER — Ambulatory Visit: Payer: Self-pay | Admitting: Physical Therapy

## 2015-05-04 ENCOUNTER — Encounter: Payer: Self-pay | Admitting: Physical Therapy

## 2015-05-04 DIAGNOSIS — M25612 Stiffness of left shoulder, not elsewhere classified: Secondary | ICD-10-CM

## 2015-05-04 DIAGNOSIS — M546 Pain in thoracic spine: Secondary | ICD-10-CM

## 2015-05-04 DIAGNOSIS — M545 Low back pain, unspecified: Secondary | ICD-10-CM

## 2015-05-04 NOTE — Therapy (Signed)
[address] [place], [place], [address] Phone: [phone]   Fax:  [phone]  Physical Therapy Treatment  Patient Details  Name: Kenneth Bullock MRN: [id number] Date of Birth: 1966-02-04 Referring Provider: Lavone Orn  Encounter Date: 05/04/2015      PT End of Session - 05/04/15 1647    Visit Number 9   Date for PT Re-Evaluation 05/30/15   PT Start Time 1605   PT Stop Time 1648   PT Time Calculation (min) 43 min   Activity Tolerance Patient tolerated treatment well   Behavior During Therapy [facility] for tasks assessed/performed      History reviewed. No pertinent past medical history.  Past Surgical History  Procedure Laterality Date  . Toe surgery      There were no vitals filed for this visit.  Visit Diagnosis:  Bilateral thoracic back pain  Shoulder stiffness, left  Bilateral low back pain without sciatica      Subjective Assessment - 05/04/15 1606    Subjective Pt reports that he has been having radiating pain down his R LE since last week   Currently in Pain? Yes   Pain Score 6    Pain Location Back                         [facility] Adult PT Treatment/Exercise - 05/04/15 0001    Lumbar Exercises: Stretches   Passive Hamstring Stretch 5 reps;10 seconds   Single Knee to Chest Stretch 5 reps;10 seconds   Quad Stretch 3 reps;10 seconds   ITB Stretch 3 reps;10 seconds   Piriformis Stretch 4 reps;10 seconds   Lumbar Exercises: Aerobic   Stationary Bike NuStep L5 x6   Lumbar Exercises: Standing   Other Standing Lumbar Exercises squats 2x10    Lumbar Exercises: Supine   Bridge 20 reps;1 second   Straight Leg Raise 1 second;15 reps   Lumbar Exercises: Prone   Other Prone Lumbar Exercises Plank 45 sec X2   Shoulder Exercises: Power Armed forces logistics/support/administrative officer rows #35 2x15    Other Power [employer] Exercises Lat pull downs #35 2x15, Standing straight arm pull  downs #35 2x15    Iontophoresis   Type of Iontophoresis Dexamethasone   Location R low back    Dose 80 mA   Time 4 hours    Manual Therapy   Manual Therapy Myofascial release;Soft tissue mobilization   Soft tissue mobilization trigger point Low Bak                   PT Short Term Goals - 04/05/15 1648    PT SHORT TERM GOAL #1   Title independent with initial HEP   Status Achieved           PT Long Term Goals - 04/29/15 1616    PT LONG TERM GOAL #1   Title understand proper posture and body mechanics   Status Partially Met   PT LONG TERM GOAL #2   Title decrease pain 50%   Status Partially Met               Plan - 05/04/15 1648    Clinical Impression Statement Pt reports radiating pain down RLE, todays treatment session focused on core strengthening and hip flexibility. Pt with tight bilat HS and quads. Pt also demos poor core strength with planks .   Pt will benefit from skilled  therapeutic intervention in order to improve on the following deficits Decreased strength;Decreased range of motion;Impaired flexibility;Increased muscle spasms;Pain   Rehab Potential Good   PT Frequency 2x / week   PT Duration 8 weeks   PT Treatment/Interventions Moist Heat;Electrical Stimulation;ADLs/Self Care Home Management;Therapeutic activities;Therapeutic exercise;Manual techniques;Patient/family education;Iontophoresis 4mg /ml Dexamethasone   PT Next Visit Plan Core strengthening        Problem List There are no active problems to display for this patient.   Scot Jun, PTA 05/04/2015, 4:50 PM  [address] [place], [place], [address] Phone: [phone]   Fax:  [phone]  Name: Kenneth Bullock MRN: [id number] Date of Birth: 1965-11-01

## 2015-05-06 ENCOUNTER — Ambulatory Visit: Payer: Self-pay | Admitting: Physical Therapy

## 2015-05-06 ENCOUNTER — Encounter: Payer: Self-pay | Admitting: Physical Therapy

## 2015-05-06 DIAGNOSIS — M546 Pain in thoracic spine: Secondary | ICD-10-CM

## 2015-05-06 DIAGNOSIS — M545 Low back pain, unspecified: Secondary | ICD-10-CM

## 2015-05-06 DIAGNOSIS — M25612 Stiffness of left shoulder, not elsewhere classified: Secondary | ICD-10-CM

## 2015-05-06 NOTE — Therapy (Signed)
[address] [place], [place], [address] Phone: [phone]   Fax:  [phone]  Physical Therapy Treatment  Patient Details  Name: Kenneth Bullock MRN: [id number] Date of Birth: 1966/05/08 Referring Provider: Lavone Orn  Encounter Date: 05/06/2015      PT End of Session - 05/06/15 1600    Visit Number 10   Date for PT Re-Evaluation 05/30/15   PT Start Time 3662   PT Stop Time 9476   PT Time Calculation (min) 59 min      History reviewed. No pertinent past medical history.  Past Surgical History  Procedure Laterality Date  . Toe surgery      There were no vitals filed for this visit.  Visit Diagnosis:  Bilateral low back pain without sciatica  Shoulder stiffness, left  Bilateral thoracic back pain      Subjective Assessment - 05/06/15 1522    Subjective Pt reports that he is continuing to have back pain. Last night it was hard to get comfortable.    Currently in Pain? Yes   Pain Score 5    Pain Location Back   Pain Orientation Right;Lower                         [facility] Adult PT Treatment/Exercise - 05/06/15 0001    Lumbar Exercises: Aerobic   Stationary Bike NuStep L3 x7 min    Lumbar Exercises: Machines for Strengthening   Cybex Knee Extension #35 2x15    Cybex Knee Flexion #45 2x15    Leg Press 60# 2x 15   Lumbar Exercises: Standing   Other Standing Lumbar Exercises squats 2x10    Other Standing Lumbar Exercises Standing rev grip rows #35 2x15, Resisted side steps blue Tband     Lumbar Exercises: Prone   Other Prone Lumbar Exercises Plank 45 sec X2   Shoulder Exercises: Seated   Row 15 reps  2 sets    Row Weight (lbs) 45lb   Other Seated Exercises Seated Lat pull downs 2x15 #35    Modalities   Modalities Moist Heat;Electrical Stimulation;Iontophoresis   Moist Heat Therapy   Number Minutes Moist Heat 15 Minutes   Moist Heat Location Lumbar Spine   Electrical Stimulation   Electrical Stimulation Location Lumbar   Electrical Stimulation Action Premod   Electrical Stimulation Parameters tolerance    Electrical Stimulation Goals Pain                  PT Short Term Goals - 04/05/15 1648    PT SHORT TERM GOAL #1   Title independent with initial HEP   Status Achieved           PT Long Term Goals - 04/29/15 1616    PT LONG TERM GOAL #1   Title understand proper posture and body mechanics   Status Partially Met   PT LONG TERM GOAL #2   Title decrease pain 50%   Status Partially Met               Plan - 05/06/15 1601    Clinical Impression Statement Pt reports that his shoulder is doing good. Pt has a superficial  mobile nodule to the R side of his lumber spine. Pt reports pain radiating don from lumbar spine to R glut area. Pt demos good strength with therapeutic exercises But continuously reports pain. Pt advises to return to MD.   Pt will  benefit from skilled therapeutic intervention in order to improve on the following deficits Decreased strength;Decreased range of motion;Impaired flexibility;Increased muscle spasms;Pain   Rehab Potential Good   PT Frequency 2x / week   PT Duration 8 weeks   PT Treatment/Interventions Moist Heat;Electrical Stimulation;ADLs/Self Care Home Management;Therapeutic activities;Therapeutic exercise;Manual techniques;Patient/family education;Iontophoresis 34m/ml Dexamethasone   PT Next Visit Plan Core strengthening        Problem List There are no active problems to display for this patient.   RScot Jun PTA  05/06/2015, 4:04 PM  [address] [address]Phone: [phone]  Fax:  [phone] Name: Kenneth OHALLORANMRN: [id number] of Birth: [birth year]

## 2015-05-11 ENCOUNTER — Encounter: Payer: Self-pay | Admitting: Physical Therapy

## 2015-05-11 ENCOUNTER — Ambulatory Visit: Payer: Self-pay | Admitting: Physical Therapy

## 2015-05-11 DIAGNOSIS — M545 Low back pain, unspecified: Secondary | ICD-10-CM

## 2015-05-11 DIAGNOSIS — M25612 Stiffness of left shoulder, not elsewhere classified: Secondary | ICD-10-CM

## 2015-05-11 DIAGNOSIS — M546 Pain in thoracic spine: Secondary | ICD-10-CM

## 2015-05-11 NOTE — Therapy (Signed)
[address] [place], [place], [address] Phone: [phone]   Fax:  [phone]  Physical Therapy Treatment  Patient Details  Name: Kenneth Bullock MRN: [id number] Date of Birth: [birth year] Referring Provider: Lavone Orn  Encounter Date: 05/11/2015      PT End of Session - 05/11/15 1639    Visit Number 11   Date for PT Re-Evaluation 05/30/15   PT Start Time 5956   PT Stop Time 1639   PT Time Calculation (min) 49 min   Activity Tolerance Patient tolerated treatment well   Behavior During Therapy [facility] for tasks assessed/performed      History reviewed. No pertinent past medical history.  Past Surgical History  Procedure Laterality Date  . Toe surgery      There were no vitals filed for this visit.  Visit Diagnosis:  Bilateral thoracic back pain  Shoulder stiffness, left  Bilateral low back pain without sciatica      Subjective Assessment - 05/11/15 1551    Subjective Pt reports that things are the same, still has nodule in his low back.   Currently in Pain? Yes   Pain Score 5    Pain Location Back                         [facility] Adult PT Treatment/Exercise - 05/11/15 0001    Lumbar Exercises: Aerobic   Stationary Bike NuStep L3 x7 min    Lumbar Exercises: Machines for Strengthening   Cybex Knee Extension #35 2x15    Cybex Knee Flexion #45 2x15    Leg Press 60# 2x 15   Lumbar Exercises: Standing   Other Standing Lumbar Exercises squats 2x10    Other Standing Lumbar Exercises Standing rev grip rows #35 2x15, Resisted side steps blue Tband; standing straight arm pull downs #35 2    Lumbar Exercises: Seated   Other Seated Lumbar Exercises Seated obliques blue weighted ball LE elevated 2x10    Lumbar Exercises: Supine   Bridge 20 reps;1 second   Straight Leg Raise 1 second;15 reps   Lumbar Exercises: Prone   Other Prone Lumbar Exercises Plank 45 sec X2   Shoulder  Exercises: Seated   Row 15 reps  2 sets    Row Weight (lbs) 45lb   Other Seated Exercises Seated Lat pull downs 2x15 #45    Iontophoresis   Type of Iontophoresis Dexamethasone   Location R low back    Dose 80 mA   Time 4 hours                   PT Short Term Goals - 04/05/15 1648    PT SHORT TERM GOAL #1   Title independent with initial HEP   Status Achieved           PT Long Term Goals - 04/29/15 1616    PT LONG TERM GOAL #1   Title understand proper posture and body mechanics   Status Partially Met   PT LONG TERM GOAL #2   Title decrease pain 50%   Status Partially Met               Plan - 05/11/15 1640    Clinical Impression Statement Pt performed all interventions well this date. Pt demos good strength and ROM. Continues to report LBP originating from a nodule in low back. Pt reports that his shoulder is good but has occasional  back spasms.   Pt will benefit from skilled therapeutic intervention in order to improve on the following deficits Decreased strength;Decreased range of motion;Impaired flexibility;Increased muscle spasms;Pain   Rehab Potential Good   PT Frequency 2x / week   PT Duration 8 weeks   PT Treatment/Interventions Moist Heat;Electrical Stimulation;ADLs/Self Care Home Management;Therapeutic activities;Therapeutic exercise;Manual techniques;Patient/family education;Iontophoresis 4mg /ml Dexamethasone   PT Next Visit Plan get report from MD        Problem List There are no active problems to display for this patient.   Scot Jun, PTA  05/11/2015, 4:44 PM  [address] [place], [place], [address] Phone: [phone]   Fax:  [phone]  Name: KWELI GRASSEL MRN: [id number] Date of Birth: 1966/03/20

## 2015-05-13 ENCOUNTER — Ambulatory Visit: Payer: Self-pay | Admitting: Physical Therapy

## 2015-05-13 ENCOUNTER — Encounter: Payer: Self-pay | Admitting: Physical Therapy

## 2015-05-13 DIAGNOSIS — M546 Pain in thoracic spine: Secondary | ICD-10-CM

## 2015-05-13 DIAGNOSIS — M545 Low back pain, unspecified: Secondary | ICD-10-CM

## 2015-05-13 DIAGNOSIS — M25612 Stiffness of left shoulder, not elsewhere classified: Secondary | ICD-10-CM

## 2015-05-13 NOTE — Therapy (Addendum)
[facility] [place] [address] [place], [place], [address] Phone: [phone]   Fax:  [phone]  Physical Therapy Treatment  Patient Details  Name: Kenneth Bullock MRN: [id number] Date of Birth: 10/17/1965 Referring Provider: John Griffin  Encounter Date: 05/13/2015      PT End of Session - 05/13/15 1557    Visit Number 12   Date for PT Re-Evaluation 05/30/15   PT Start Time 1515   PT Stop Time 1557   PT Time Calculation (min) 42 min   Activity Tolerance Patient tolerated treatment well   Behavior During Therapy [facility] for tasks assessed/performed      History reviewed. No pertinent past medical history.  Past Surgical History  Procedure Laterality Date  . Toe surgery      There were no vitals filed for this visit.  Visit Diagnosis:  Bilateral low back pain without sciatica  Shoulder stiffness, left  Bilateral thoracic back pain      Subjective Assessment - 05/13/15 1521    Subjective Pt reports that he went to MD, MD stated that he didn't believe the fatty tissue was causing back pain. MD schedules MRI   Currently in Pain? Yes   Pain Score 5    Pain Location Back   Pain Orientation Right                         [facility] Adult PT Treatment/Exercise - 05/13/15 0001    Lumbar Exercises: Aerobic   Stationary Bike NuStep L4 x7 min    Lumbar Exercises: Machines for Strengthening   Cybex Knee Extension #35 2x15    Cybex Knee Flexion #45 2x15    Leg Press 60# 2x 15   Lumbar Exercises: Standing   Other Standing Lumbar Exercises Standing hid abd/add & ext #5 2x15   Lumbar Exercises: Seated   Other Seated Lumbar Exercises Seated obliques blue weighted ball LE elevated 2x10    Lumbar Exercises: Prone   Other Prone Lumbar Exercises Plank 45 sec   Shoulder Exercises: Power Tower   Other Power Tower Exercises Seated rows #35 2x15    Other Power Tower Exercises Lat pull downs #35 2x15, Standing rev  grip rows #35 2x15; Standing straight arm pull downs #35 2x15    Iontophoresis   Type of Iontophoresis Dexamethasone   Location R low back    Dose 80 mA   Time 4 hours                   PT Short Term Goals - 04/05/15 1648    PT SHORT TERM GOAL #1   Title independent with initial HEP   Status Achieved           PT Long Term Goals - 04/29/15 1616    PT LONG TERM GOAL #1   Title understand proper posture and body mechanics   Status Partially Met   PT LONG TERM GOAL #2   Title decrease pain 50%   Status Partially Met               Plan - 05/13/15 1558    Clinical Impression Statement Pt able to complete all exercises well. Still has painful nodule in low back. No improvement, MD has schedule MRI. Will hold PT for 2 weeks.   PT Next Visit Plan Spoke with lead PT, Will place pt on 2 week hold to receive MRI results.       PHYSICAL THERAPY DISCHARGE SUMMARY  Visits from Start of Care: 12  Plan: Patient agrees to discharge.  Patient goals were partially met. Patient is being discharged due to being pleased with the current functional level.  ?????         Problem List There are no active problems to display for this patient.   Scot Jun, PTA  05/13/2015, 4:00 PM  [address] [place], [place], [address] Phone: [phone]   Fax:  [phone]  Name: DEROLD DORSCH MRN: [id number] Date of Birth: [birth year]

## 2015-05-14 ENCOUNTER — Other Ambulatory Visit: Payer: Self-pay | Admitting: General Practice

## 2015-05-14 ENCOUNTER — Other Ambulatory Visit: Payer: Self-pay | Admitting: Internal Medicine

## 2015-05-14 DIAGNOSIS — M545 Low back pain: Secondary | ICD-10-CM

## 2015-05-18 ENCOUNTER — Ambulatory Visit: Payer: Self-pay | Admitting: Physical Therapy

## 2015-05-20 ENCOUNTER — Ambulatory Visit: Payer: Self-pay | Admitting: Physical Therapy

## 2015-05-26 ENCOUNTER — Ambulatory Visit
Admission: RE | Admit: 2015-05-26 | Discharge: 2015-05-26 | Disposition: A | Discharge status: Home / Self Care | Payer: No Typology Code available for payment source | Source: Ambulatory Visit | Attending: General Practice | Admitting: General Practice

## 2015-05-26 DIAGNOSIS — M545 Low back pain: Secondary | ICD-10-CM

## 2015-06-15 ENCOUNTER — Ambulatory Visit: Payer: Managed Care, Other (non HMO) | Attending: Internal Medicine | Admitting: Physical Therapy

## 2015-06-15 ENCOUNTER — Encounter: Payer: Self-pay | Admitting: Physical Therapy

## 2015-06-15 DIAGNOSIS — M25612 Stiffness of left shoulder, not elsewhere classified: Secondary | ICD-10-CM | POA: Diagnosis present

## 2015-06-15 DIAGNOSIS — M545 Low back pain, unspecified: Secondary | ICD-10-CM

## 2015-06-15 DIAGNOSIS — M546 Pain in thoracic spine: Secondary | ICD-10-CM | POA: Insufficient documentation

## 2015-06-15 DIAGNOSIS — M5126 Other intervertebral disc displacement, lumbar region: Secondary | ICD-10-CM | POA: Diagnosis present

## 2015-06-15 DIAGNOSIS — M5136 Other intervertebral disc degeneration, lumbar region: Secondary | ICD-10-CM

## 2015-06-15 NOTE — Therapy (Signed)
[facility] [place] [address] [place], [place], [address] Phone: [phone]   Fax:  [phone]  Physical Therapy Evaluation  Patient Details  Name: Kenneth Bullock MRN: [id number] Date of Birth: Sep 14, 1965 Referring Provider: Yevette Edwards  Encounter Date: 06/15/2015      PT End of Session - 06/15/15 1646    Visit Number 1   Date for PT Re-Evaluation 08/13/15   PT Start Time 1604   PT Stop Time 1655   PT Time Calculation (min) 51 min   Activity Tolerance Patient tolerated treatment well   Behavior During Therapy [facility] for tasks assessed/performed      History reviewed. No pertinent past medical history.  Past Surgical History  Procedure Laterality Date  . Toe surgery      There were no vitals filed for this visit.  Visit Diagnosis:  Bilateral low back pain without sciatica  Bulging lumbar disc      Subjective Assessment - 06/15/15 1608    Subjective Patient was seen here until 05/13/15.  He reports that he was getting better but continued to have right SI area pain.  He had a recent MRI that showed bulging disc at L4-5.   Diagnostic tests recent MRI   Currently in Pain? Yes   Pain Score 5    Pain Location Back   Pain Orientation Right   Pain Descriptors / Indicators Aching   Pain Type Chronic pain   Pain Onset More than a month ago   Pain Frequency Constant   Aggravating Factors  some lying down, standing, at worst pain is up to 8/10   Pain Relieving Factors sitting, rest, motrin   Effect of Pain on Daily Activities limits ability to lift, play basketball, running            [facility] [place] PT Assessment - 06/15/15 0001    Assessment   Medical Diagnosis low back pain   Referring Provider Dumonski   Onset Date/Surgical Date 06/11/15   Prior Therapy yes for the above 15 visits    Precautions   Precautions None   Balance Screen   Has the patient fallen in the past 6 months No   Has the patient had a decrease  in activity level because of a fear of falling?  No   Is the patient reluctant to leave their home because of a fear of falling?  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  All AROM of the trunk caused pain and he was timid in his motions   Strength   Overall Strength Comments hips 5/5 for the left,. 4/5 on the right with some c/o pain in the right SI   Flexibility   Soft Tissue Assessment /Muscle Length --  very tight HS and quads   Palpation   Palpation comment very tender to the right SI, + sacral thrust test, other tests negative, = leg length, spasms in the lumbar p-spinals   Special Tests    Special Tests --  + SLR bilaterally at 50 degrees  Ambulation/Gait   Gait Comments mild antalgic gait on the right                   [facility] Adult PT Treatment/Exercise - 06/15/15 0001    Moist Heat Therapy   Number Minutes Moist Heat 15 Minutes   Moist Heat Location Lumbar Spine   Electrical Stimulation   Electrical Stimulation Location Lumbar   Electrical Stimulation Action IFC   Electrical Stimulation Parameters supine   Electrical Stimulation Goals Pain                  PT Short Term Goals - 06/15/15 1655    PT SHORT TERM GOAL #1   Title independent with initial HEP   Time 2   Period Weeks   Status New           PT Long Term Goals - 06/15/15 1655    PT LONG TERM GOAL #1   Title understand proper posture and body mechanics   Time 8   Period Weeks   Status New   PT LONG TERM GOAL #2   Title decrease pain 50%   Time 8   Period  Weeks   Status New   PT LONG TERM GOAL #3   Title increase lumbar ROM 50%   Time 8   Period Weeks   Status New   PT LONG TERM GOAL #4   Title tolerate standing 30 minutes   Time 8   Period Weeks   Status New               Plan - 06/15/15 1651    Clinical Impression Statement Patient returns after being on hold x 4 weeks, he underwent an MRI and now has dx of L4-5 disc bulge 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  HE is having an injection tomorrow, I elected to not give exercises today or try traction with him having the injection tomorrow.   Pt will benefit from skilled therapeutic intervention in order to improve on the following deficits Decreased strength;Decreased range of motion;Impaired flexibility;Increased muscle spasms;Pain   Rehab Potential Good   PT Frequency 2x / week   PT Duration 8 weeks   PT Treatment/Interventions Moist Heat;Electrical Stimulation;ADLs/Self Care Home Management;Therapeutic activities;Therapeutic exercise;Manual techniques;Patient/family education;Iontophoresis /ml Dexamethasone;Traction   PT Next Visit Plan We will resume treatment next week, will try traction, McKenzie exercises and SI stability exercises   Consulted and Agree with Plan of Care Patient         Problem List There are no active problems to display for this patient.   Jearld Lesch., PT 06/15/2015, 5:00 PM  [facility] [place] [address] [place], [place], [address] Phone: [phone]   Fax:  [phone]  Name: Kenneth Bullock MRN: [id number] Date of Birth: 1966-04-02

## 2015-06-21 ENCOUNTER — Encounter: Payer: Self-pay | Admitting: Physical Therapy

## 2015-06-21 ENCOUNTER — Ambulatory Visit: Payer: Managed Care, Other (non HMO) | Admitting: Physical Therapy

## 2015-06-21 DIAGNOSIS — M545 Low back pain, unspecified: Secondary | ICD-10-CM

## 2015-06-21 DIAGNOSIS — M546 Pain in thoracic spine: Secondary | ICD-10-CM

## 2015-06-21 DIAGNOSIS — M5126 Other intervertebral disc displacement, lumbar region: Secondary | ICD-10-CM

## 2015-06-21 DIAGNOSIS — M5136 Other intervertebral disc degeneration, lumbar region: Secondary | ICD-10-CM

## 2015-06-21 NOTE — Therapy (Signed)
[facility] [address] [place], [place], [address] Phone: [phone]   Fax:  [phone]  Physical Therapy Treatment  Patient Details  Name: Kenneth Bullock MRN: [id number] Date of Birth: 12/11/1965 Referring Provider: Yevette Edwards  Encounter Date: 06/21/2015      PT End of Session - 06/21/15 0844    Visit Number 2   Date for PT Re-Evaluation 08/13/15   PT Start Time 0803   PT Stop Time 0857   PT Time Calculation (min) 54 min   Activity Tolerance Patient tolerated treatment well   Behavior During Therapy [facility] for tasks assessed/performed      History reviewed. No pertinent past medical history.  Past Surgical History  Procedure Laterality Date  . Toe surgery      There were no vitals filed for this visit.  Visit Diagnosis:  Bilateral thoracic back pain  Bulging lumbar disc  Bilateral low back pain without sciatica      Subjective Assessment - 06/21/15 0805    Subjective Pt reports no change, pt reports that he was at work and was walking on a slope and R leg gave out and he tweaked his let knee and felt a pop. Does have medial L knee pain with  lateral movement   Currently in Pain? Yes   Pain Score 4    Pain Location Back  L knee 3/10                         [facility] Adult PT Treatment/Exercise - 06/21/15 0001    Lumbar Exercises: Stretches   Single Knee to Chest Stretch 1 rep;10 seconds   Double Knee to Chest Stretch 4 reps;10 seconds   Lower Trunk Rotation 3 reps;10 seconds   Lumbar Exercises: Aerobic   Stationary Bike NuStep L5 x7 min    Lumbar Exercises: Supine   Bridge 15 reps;2 seconds   Shoulder Exercises: Supine   Other Supine Exercises Prone lying ~2 min, Prone on elbow  ~2 min, Prone push ups 2x10   Modalities   Modalities Traction   Traction   Type of Traction Lumbar   Max (lbs) 80   Time 12                  PT Short Term Goals - 06/15/15 1655    PT  SHORT TERM GOAL #1   Title independent with initial HEP   Time 2   Period Weeks   Status New           PT Long Term Goals - 06/15/15 1655    PT LONG TERM GOAL #1   Title understand proper posture and body mechanics   Time 8   Period Weeks   Status New   PT LONG TERM GOAL #2   Title decrease pain 50%   Time 8   Period Weeks   Status New   PT LONG TERM GOAL #3   Title increase lumbar ROM 50%   Time 8   Period Weeks   Status New   PT LONG TERM GOAL #4   Title tolerate standing 30 minutes   Time 8   Period Weeks   Status New               Plan - 06/21/15 5638    Clinical Impression Statement Pt with minor pain with prone push ups and Lumbar stretch, otherwise no issues. P tolerates lumber traction  without any increase in pain.    PT Next Visit Plan Access traction, Continue with McKenzie exercises and SI stability         Problem List There are no active problems to display for this patient.   Grayce Sessions, PTA  06/21/2015, 8:50 AM  [facility] [address] [place], [place], [address] Phone: [phone]   Fax:  [phone]  Name: Kenneth Bullock MRN: [id number] Date of Birth: 1965-10-15

## 2015-06-23 ENCOUNTER — Encounter: Payer: Self-pay | Admitting: Physical Therapy

## 2015-06-23 ENCOUNTER — Ambulatory Visit: Payer: Managed Care, Other (non HMO) | Admitting: Physical Therapy

## 2015-06-23 DIAGNOSIS — M546 Pain in thoracic spine: Secondary | ICD-10-CM

## 2015-06-23 DIAGNOSIS — M545 Low back pain, unspecified: Secondary | ICD-10-CM

## 2015-06-23 DIAGNOSIS — M5136 Other intervertebral disc degeneration, lumbar region: Secondary | ICD-10-CM

## 2015-06-23 DIAGNOSIS — M25612 Stiffness of left shoulder, not elsewhere classified: Secondary | ICD-10-CM

## 2015-06-23 DIAGNOSIS — M5126 Other intervertebral disc displacement, lumbar region: Secondary | ICD-10-CM

## 2015-06-23 NOTE — Therapy (Signed)
[place], [place], [address] Phone: [phone]   Fax:  [phone]  Physical Therapy Treatment  Patient Details  Name: Kenneth Bullock MRN: [id number] Date of Birth: 1966/01/18 Referring Provider: Lynann Bologna  Encounter Date: 06/23/2015      PT End of Session - 06/23/15 0933    Visit Number 3   Date for PT Re-Evaluation 08/13/15   PT Start Time 0849   PT Stop Time 0946   PT Time Calculation (min) 57 min   Activity Tolerance Patient tolerated treatment well   Behavior During Therapy [facility] for tasks assessed/performed      History reviewed. No pertinent past medical history.  Past Surgical History  Procedure Laterality Date  . Toe surgery      There were no vitals filed for this visit.  Visit Diagnosis:  Shoulder stiffness, left  Bilateral low back pain without sciatica  Bulging lumbar disc  Bilateral thoracic back pain      Subjective Assessment - 06/23/15 0849    Subjective Pt reports that he felt real good after last PT session.    Currently in Pain? Yes   Pain Score 3   Did take med's this morning   Pain Location Back                         [facility] Adult PT Treatment/Exercise - 06/23/15 0001    Lumbar Exercises: Stretches   Single Knee to Chest Stretch 1 rep;10 seconds   Double Knee to Chest Stretch 4 reps;10 seconds   Lower Trunk Rotation 3 reps;10 seconds   Lumbar Exercises: Aerobic   Stationary Bike NuStep L5 x7 min    Lumbar Exercises: Standing   Other Standing Lumbar Exercises Standing hip ext #5 x10 both   Lumbar Exercises: Seated   Other Seated Lumbar Exercises Lat pull downs #25 x10; #35 x10; Rows #35 2x10   Lumbar Exercises: Supine   Bridge 15 reps;3 seconds   Other Supine Lumbar Exercises Iso bridge and march 2x10   Lumbar Exercises: Prone   Other Prone Lumbar Exercises Prone lying ~37mn, Prone on elbow, ~3 min, Prone push ups 2x10   Modalities   Modalities Traction   Traction   Type of Traction Lumbar   Max (lbs) 90   Time 13                  PT Short Term Goals - 06/15/15 1655    PT SHORT TERM GOAL #1   Title independent with initial HEP   Time 2   Period Weeks   Status New           PT Long Term Goals - 06/23/15 02536   PT LONG TERM GOAL #1   Title understand proper posture and body mechanics   Status Partially Met               Plan - 06/23/15 0933    Clinical Impression Statement Continues with minor pain with bridges and prone push ups, Tolerated to postural strengthening interventions without issue. Positive response to traction from last treatment   Pt will benefit from skilled therapeutic intervention in order to improve on the following deficits Decreased strength;Decreased range of motion;Impaired flexibility;Increased muscle spasms;Pain   Rehab Potential Good   PT Frequency 2x / week   PT Duration 8 weeks   PT Treatment/Interventions Moist Heat;Electrical Stimulation;ADLs/Self Care Home Management;Therapeutic activities;Therapeutic exercise;Manual  techniques;Patient/family education;Iontophoresis 24m/ml Dexamethasone;Traction   PT Next Visit Plan Continue with Mekenzie exercises SI stability, and traction        Problem List There are no active problems to display for this patient.   RScot Jun PTA  06/23/2015, 9:53 AM  [place] [address]Phone: [phone]  Fax:  [phone] Name: JPAOLO OKANEMRN: [id number] of Birth: 3Dec 20, 1967

## 2015-06-28 ENCOUNTER — Ambulatory Visit: Payer: Managed Care, Other (non HMO) | Admitting: Physical Therapy

## 2015-06-28 ENCOUNTER — Encounter: Payer: Self-pay | Admitting: Physical Therapy

## 2015-06-28 DIAGNOSIS — M5136 Other intervertebral disc degeneration, lumbar region: Secondary | ICD-10-CM

## 2015-06-28 DIAGNOSIS — M545 Low back pain, unspecified: Secondary | ICD-10-CM

## 2015-06-28 DIAGNOSIS — M546 Pain in thoracic spine: Secondary | ICD-10-CM

## 2015-06-28 DIAGNOSIS — M25612 Stiffness of left shoulder, not elsewhere classified: Secondary | ICD-10-CM

## 2015-06-28 DIAGNOSIS — M5126 Other intervertebral disc displacement, lumbar region: Secondary | ICD-10-CM

## 2015-06-28 NOTE — Therapy (Signed)
[place], [place], [address] Phone: [phone]   Fax:  [phone]  Physical Therapy Treatment  Patient Details  Name: XHAIDEN COOMBS MRN: [id number] Date of Birth: 16-Jul-1965 Referring Provider: Lynann Bologna  Encounter Date: 06/28/2015      PT End of Session - 06/28/15 0841    Visit Number 4   Date for PT Re-Evaluation 08/13/15   PT Start Time 0805   PT Stop Time 6962   PT Time Calculation (min) 49 min   Activity Tolerance Patient tolerated treatment well   Behavior During Therapy [facility] for tasks assessed/performed      History reviewed. No pertinent past medical history.  Past Surgical History  Procedure Laterality Date  . Toe surgery      There were no vitals filed for this visit.  Visit Diagnosis:  Bilateral thoracic back pain  Bulging lumbar disc  Bilateral low back pain without sciatica  Shoulder stiffness, left      Subjective Assessment - 06/28/15 0805    Subjective "My back is hurting in a different spot now, Its like stiff in the middle"   Currently in Pain? Yes   Pain Score 5    Pain Location Back   Pain Orientation Mid                         [facility] Adult PT Treatment/Exercise - 06/28/15 0001    Lumbar Exercises: Aerobic   Stationary Bike NuStep L5 x6 min    Lumbar Exercises: Standing   Other Standing Lumbar Exercises Standing hip ext #10 x10 both   Lumbar Exercises: Seated   Other Seated Lumbar Exercises Lat pull downs #35 2x15 Rows #35 2x10   Lumbar Exercises: Supine   Bridge 15 reps;3 seconds   Other Supine Lumbar Exercises Iso bridge and march 2x10   Lumbar Exercises: Prone   Other Prone Lumbar Exercises Prone lying ~64mn, Prone on elbow, ~3 min, Prone push ups 2x10   Modalities   Modalities Traction;Moist Heat   Moist Heat Therapy   Number Minutes Moist Heat 13 Minutes   Moist Heat Location Lumbar Spine   Traction   Type of Traction  Lumbar   Max (lbs) 85   Time 13                  PT Short Term Goals - 06/28/15 0844    PT SHORT TERM GOAL #1   Title independent with initial HEP   Status Achieved           PT Long Term Goals - 06/28/15 0844    PT LONG TERM GOAL #4   Title tolerate standing 30 minutes   Status Partially Met               Plan - 06/28/15 0842    Clinical Impression Statement Pt reports increase pain this weekend but the pain was in the center of his back now. Treatment unchanged from previous PT session. The resistance was increased from previous treatment  tolerated without issue.    Pt will benefit from skilled therapeutic intervention in order to improve on the following deficits Decreased strength;Decreased range of motion;Impaired flexibility;Increased muscle spasms;Pain   Rehab Potential Good   PT Frequency 2x / week   PT Duration 8 weeks   PT Treatment/Interventions Moist Heat;Electrical Stimulation;ADLs/Self Care Home Management;Therapeutic activities;Therapeutic exercise;Manual techniques;Patient/family education;Iontophoresis '4mg'$ /ml Dexamethasone;Traction   PT Next Visit Plan  Continue with Mekenzie exercises SI stibility, and traction        Problem List There are no active problems to display for this patient.   Scot Jun, PTA  06/28/2015, 8:45 AM  [place], [place], [address] Phone: [phone]   Fax:  [phone]  Name: RIAD WAGLEY MRN: [id number] Date of Birth: [birth year]

## 2015-06-30 ENCOUNTER — Encounter: Payer: Self-pay | Admitting: Physical Therapy

## 2015-06-30 ENCOUNTER — Ambulatory Visit: Payer: Managed Care, Other (non HMO) | Attending: Internal Medicine | Admitting: Physical Therapy

## 2015-06-30 DIAGNOSIS — M545 Low back pain, unspecified: Secondary | ICD-10-CM

## 2015-06-30 DIAGNOSIS — M546 Pain in thoracic spine: Secondary | ICD-10-CM | POA: Insufficient documentation

## 2015-06-30 DIAGNOSIS — M5136 Other intervertebral disc degeneration, lumbar region: Secondary | ICD-10-CM

## 2015-06-30 DIAGNOSIS — M5126 Other intervertebral disc displacement, lumbar region: Secondary | ICD-10-CM | POA: Insufficient documentation

## 2015-06-30 NOTE — Therapy (Signed)
[facility] [place], [place], [address] Phone: [phone]   Fax:  [phone]  Physical Therapy Treatment  Patient Details  Name: Kenneth Bullock MRN: [id number] Date of Birth: 12/15/1965 Referring Provider: Lynann Bologna  Encounter Date: 06/30/2015      PT End of Session - 06/30/15 0935    Visit Number 5   Date for PT Re-Evaluation 08/13/15   PT Start Time 0848   PT Stop Time 0948   PT Time Calculation (min) 60 min   Activity Tolerance Patient tolerated treatment well   Behavior During Therapy [facility] for tasks assessed/performed      History reviewed. No pertinent past medical history.  Past Surgical History  Procedure Laterality Date  . Toe surgery      There were no vitals filed for this visit.  Visit Diagnosis:  Bilateral thoracic back pain  Bulging lumbar disc  Bilateral low back pain without sciatica      Subjective Assessment - 06/30/15 0849    Subjective "Still hurting in the same spot, toward the middle"   Pain Score 4    Pain Location Back   Pain Orientation Left                         [facility] Adult PT Treatment/Exercise - 06/30/15 0001    Lumbar Exercises: Stretches   Passive Hamstring Stretch 4 reps;10 seconds   Single Knee to Chest Stretch 1 rep;10 seconds   Piriformis Stretch 3 reps;10 seconds   Lumbar Exercises: Aerobic   Stationary Bike NuStep L5 x7 min    Lumbar Exercises: Standing   Row 15 reps;Power tower  2 sets, Rev grip   Row Limitations 35#   Other Standing Lumbar Exercises Standing hip ext #10 x15 both   Lumbar Exercises: Seated   Other Seated Lumbar Exercises Lat pull downs #35 2x15 Rows #35 2x10   Lumbar Exercises: Prone   Other Prone Lumbar Exercises Prone lying ~37mn, Prone on elbow, ~3 min, Prone push ups 2x10   Modalities   Modalities Traction;Moist Heat   Moist Heat Therapy   Number Minutes Moist Heat 15 Minutes   Moist Heat Location  Lumbar Spine   Traction   Type of Traction Lumbar   Max (lbs) 87   Time 13                  PT Short Term Goals - 06/28/15 0844    PT SHORT TERM GOAL #1   Title independent with initial HEP   Status Achieved           PT Long Term Goals - 06/30/15 01540   PT LONG TERM GOAL #2   Title decrease pain 50%   Status Partially Met   PT LONG TERM GOAL #3   Title increase lumbar ROM 50%   Status Partially Met               Plan - 06/30/15 0935    Clinical Impression Statement Continue to have little pain with prone press ups, pt reports that he is improving overall. Tolerated additional machine level interventions well.   Pt will benefit from skilled therapeutic intervention in order to improve on the following deficits Decreased strength;Decreased range of motion;Impaired flexibility;Increased muscle spasms;Pain   Rehab Potential Good   PT Frequency 2x / week   PT Duration 8 weeks   PT Treatment/Interventions Moist Heat;Electrical Stimulation;ADLs/Self Care Home  Management;Therapeutic activities;Therapeutic exercise;Manual techniques;Patient/family education;Iontophoresis '4mg'$ /ml Dexamethasone;Traction   PT Next Visit Plan Continue with Mekenzie exercises SI stibility, and traction        Problem List There are no active problems to display for this patient.   Scot Jun, PTA  06/30/2015, 9:38 AM  [place], [place], [address] Phone: [phone]   Fax:  [phone]  Name: Kenneth Bullock MRN: [id number] Date of Birth: 1966/05/21

## 2015-07-06 ENCOUNTER — Encounter: Payer: Self-pay | Admitting: Physical Therapy

## 2015-07-06 ENCOUNTER — Ambulatory Visit: Payer: Managed Care, Other (non HMO) | Admitting: Physical Therapy

## 2015-07-06 DIAGNOSIS — M545 Low back pain, unspecified: Secondary | ICD-10-CM

## 2015-07-06 DIAGNOSIS — M546 Pain in thoracic spine: Secondary | ICD-10-CM

## 2015-07-06 DIAGNOSIS — M5136 Other intervertebral disc degeneration, lumbar region: Secondary | ICD-10-CM

## 2015-07-06 DIAGNOSIS — M5126 Other intervertebral disc displacement, lumbar region: Secondary | ICD-10-CM

## 2015-07-06 NOTE — Therapy (Signed)
[place], [place], [address] Phone: [phone]   Fax:  [phone]  Physical Therapy Treatment  Patient Details  Name: Kenneth Bullock MRN: [id number] Date of Birth: [birth year] Referring Provider: Lynann Bologna  Encounter Date: 07/06/2015      PT End of Session - 07/06/15 0857    Visit Number 6   Date for PT Re-Evaluation 08/13/15   PT Start Time 0809   PT Stop Time 0908   PT Time Calculation (min) 59 min   Activity Tolerance Patient tolerated treatment well   Behavior During Therapy [facility] for tasks assessed/performed      History reviewed. No pertinent past medical history.  Past Surgical History  Procedure Laterality Date  . Toe surgery      There were no vitals filed for this visit.  Visit Diagnosis:  Bilateral thoracic back pain  Bulging lumbar disc  Bilateral low back pain without sciatica      Subjective Assessment - 07/06/15 0811    Subjective "Now its hurting on the left side now"   Currently in Pain? Yes   Pain Score 5    Pain Location Back   Pain Orientation Left            [facility] PT Assessment - 07/06/15 0001    AROM   Overall AROM Comments Lumbar ROM was decreased 50% for flexion, extension decrease 75% with pain, side bending decreased 25% with pain.  All AROM of the trunk caused pain    Left Shoulder Flexion 165 Degrees   Left Shoulder Internal Rotation 90 Degrees   Left Shoulder External Rotation 86 Degrees                     [facility] Adult PT Treatment/Exercise - 07/06/15 0001    Lumbar Exercises: Aerobic   Stationary Bike NuStep L5 x7 min    Lumbar Exercises: Standing   Row 15 reps;Power tower  2 sets, Rev grip     Row Limitations 35#   Lumbar Exercises: Seated   Other Seated Lumbar Exercises Lat pull downs #35 2x15 Rows #35 2x10   Lumbar Exercises: Supine   Bridge 15 reps;3 seconds   Other Supine Lumbar Exercises Iso bridge and march 2x10    Lumbar Exercises: Prone   Other Prone Lumbar Exercises Prone lying ~76mn, Prone on elbow, ~3 min, Prone push ups 2x10   Modalities   Modalities Traction;Moist Heat   Moist Heat Therapy   Number Minutes Moist Heat 15 Minutes   Moist Heat Location Lumbar Spine   Traction   Type of Traction Lumbar   Max (lbs) 85   Time 13                  PT Short Term Goals - 06/28/15 0844    PT SHORT TERM GOAL #1   Title independent with initial HEP   Status Achieved           PT Long Term Goals - 06/30/15 06384   PT LONG TERM GOAL #2   Title decrease pain 50%   Status Partially Met   PT LONG TERM GOAL #3   Title increase lumbar ROM 50%   Status Partially Met               Plan - 07/06/15 0901    Clinical Impression Statement Pt performed all exercises well does have reports pain with prone interventions, Small improvement with  lumbar ROM. No issues with machine intervention performed today.   Pt will benefit from skilled therapeutic intervention in order to improve on the following deficits Decreased strength;Decreased range of motion;Impaired flexibility;Increased muscle spasms;Pain   Rehab Potential Good   PT Frequency 2x / week   PT Duration 8 weeks   PT Treatment/Interventions Moist Heat;Electrical Stimulation;ADLs/Self Care Home Management;Therapeutic activities;Therapeutic exercise;Manual techniques;Patient/family education;Iontophoresis 39m/ml Dexamethasone;Traction   PT Next Visit Plan Continue with Mekenzie exercises SI stability, and traction        Problem List There are no active problems to display for this patient.   RScot Jun PTA 07/06/2015, 9:04 AM  [place] [address]Phone: [phone]  Fax:  [phone] Name: JNICKY KRASMRN: [id number] of Birth: [birth year]

## 2015-07-08 ENCOUNTER — Ambulatory Visit: Payer: Managed Care, Other (non HMO) | Admitting: Physical Therapy

## 2015-07-08 ENCOUNTER — Encounter: Payer: Self-pay | Admitting: Physical Therapy

## 2015-07-08 DIAGNOSIS — M5136 Other intervertebral disc degeneration, lumbar region: Secondary | ICD-10-CM

## 2015-07-08 DIAGNOSIS — M5126 Other intervertebral disc displacement, lumbar region: Secondary | ICD-10-CM

## 2015-07-08 DIAGNOSIS — M545 Low back pain, unspecified: Secondary | ICD-10-CM

## 2015-07-08 DIAGNOSIS — M546 Pain in thoracic spine: Secondary | ICD-10-CM

## 2015-07-08 NOTE — Therapy (Signed)
[place], [place], [address] Phone: [phone]   Fax:  [phone]  Physical Therapy Treatment  Patient Details  Name: Kenneth Bullock MRN: [id number] Date of Birth: [birth year] Referring Provider: Lynann Bologna  Encounter Date: 07/08/2015      PT End of Session - 07/08/15 0843    Visit Number 7   Date for PT Re-Evaluation 08/13/15   PT Start Time 0800   PT Stop Time 0857   PT Time Calculation (min) 57 min   Activity Tolerance Patient tolerated treatment well   Behavior During Therapy [facility] for tasks assessed/performed      History reviewed. No pertinent past medical history.  Past Surgical History  Procedure Laterality Date  . Toe surgery      There were no vitals filed for this visit.  Visit Diagnosis:  Bilateral thoracic back pain  Bulging lumbar disc  Bilateral low back pain without sciatica      Subjective Assessment - 07/08/15 0800    Subjective "Today is a good day, its some pain but better than it was"   Currently in Pain? Yes   Pain Score 3    Pain Location Back                         [facility] Adult PT Treatment/Exercise - 07/08/15 0001    Lumbar Exercises: Aerobic   Stationary Bike NuStep L5 x7 min    Lumbar Exercises: Machines for Strengthening   Leg Press 40# 2x 15   Lumbar Exercises: Standing   Other Standing Lumbar Exercises Standing straight arm pulldown #35 2x15; Standing AR press #35 x10; standing rev grip rows #45 2x15    Lumbar Exercises: Seated   Other Seated Lumbar Exercises Lat pull downs #35 2x15 Rows #35 2x10   Lumbar Exercises: Prone   Other Prone Lumbar Exercises Prone lying ~28mn, Prone on elbow, ~3 min, Prone push ups 2x10   Other Prone Lumbar Exercises Plank 20 sec x2   Modalities   Modalities Traction;Moist Heat   Moist Heat Therapy   Number Minutes Moist Heat 15 Minutes   Moist Heat Location Lumbar Spine   Traction   Type of  Traction Lumbar   Max (lbs) 85   Time 15                  PT Short Term Goals - 06/28/15 0844    PT SHORT TERM GOAL #1   Title independent with initial HEP   Status Achieved           PT Long Term Goals - 06/30/15 0937    PT LONG TERM GOAL #2   Title decrease pain 50%   Status Partially Met   PT LONG TERM GOAL #3   Title increase lumbar ROM 50%   Status Partially Met               Plan - 07/08/15 0844    Clinical Impression Statement Pt reports little pain with 20 sec planks but pain wasn't unbearable. Performed all exercises well, Constant throbbing pain in Low back that never goes away.    Pt will benefit from skilled therapeutic intervention in order to improve on the following deficits Decreased strength;Decreased range of motion;Impaired flexibility;Increased muscle spasms;Pain   Rehab Potential Good   PT Frequency 2x / week   PT Duration 8 weeks   PT Treatment/Interventions Moist Heat;Electrical Stimulation;ADLs/Self Care Home  Management;Therapeutic activities;Therapeutic exercise;Manual techniques;Patient/family education;Iontophoresis 35m/ml Dexamethasone;Traction   PT Next Visit Plan Continue with Mekenzie exercises SI stability, and traction        Problem List There are no active problems to display for this patient.   RScot Jun PTA  07/08/2015, 8:47 AM  [place] [address]Phone: [phone]  Fax:  [phone] Name: JRODOLPH HAGEMANNMRN: [id number] of Birth: 3January 10, 1967

## 2015-07-12 ENCOUNTER — Ambulatory Visit: Payer: Managed Care, Other (non HMO) | Admitting: Physical Therapy

## 2015-07-12 ENCOUNTER — Encounter: Payer: Self-pay | Admitting: Physical Therapy

## 2015-07-12 DIAGNOSIS — M545 Low back pain, unspecified: Secondary | ICD-10-CM

## 2015-07-12 DIAGNOSIS — M5126 Other intervertebral disc displacement, lumbar region: Secondary | ICD-10-CM

## 2015-07-12 DIAGNOSIS — M5136 Other intervertebral disc degeneration, lumbar region: Secondary | ICD-10-CM

## 2015-07-12 DIAGNOSIS — M546 Pain in thoracic spine: Secondary | ICD-10-CM

## 2015-07-12 NOTE — Therapy (Signed)
[place], [place], [address] Phone: [phone]   Fax:  [phone]  Physical Therapy Treatment  Patient Details  Name: Kenneth Bullock MRN: [id number] Date of Birth: 1966-05-10 Referring Provider: Lynann Bologna  Encounter Date: 07/12/2015      PT End of Session - 07/12/15 0850    Visit Number 8   Date for PT Re-Evaluation 08/13/15   PT Start Time 0814   PT Stop Time 0901   PT Time Calculation (min) 47 min   Activity Tolerance Patient tolerated treatment well   Behavior During Therapy [facility] for tasks assessed/performed      History reviewed. No pertinent past medical history.  Past Surgical History  Procedure Laterality Date  . Toe surgery      There were no vitals filed for this visit.  Visit Diagnosis:  Bilateral thoracic back pain  Bulging lumbar disc  Bilateral low back pain without sciatica      Subjective Assessment - 07/12/15 0816    Subjective "Im feeling good, maybe I found a good pill regimen"   Currently in Pain? Yes   Pain Score 3    Pain Location Back                         [facility] Adult PT Treatment/Exercise - 07/12/15 0001    Lumbar Exercises: Aerobic   Stationary Bike NuStep L5 x5 min    Lumbar Exercises: Machines for Strengthening   Leg Press 40# 2x 15   Lumbar Exercises: Standing   Other Standing Lumbar Exercises Standing hip ext #10 x15 both   Other Standing Lumbar Exercises Standing straight arm pull down #45 2x15; Standing AR press #35 x10; standing rev grip rows #45 2x15    Lumbar Exercises: Seated   Other Seated Lumbar Exercises Lat pull downs #35 2x15 Rows #35 2x10   Modalities   Modalities Traction;Moist Heat   Moist Heat Therapy   Number Minutes Moist Heat 15 Minutes   Moist Heat Location Lumbar Spine   Traction   Type of Traction Lumbar   Max (lbs) 85   Time 15                  PT Short Term Goals - 06/28/15 0844    PT SHORT TERM GOAL #1   Title independent with initial HEP   Status Achieved           PT Long Term Goals - 06/30/15 0937    PT LONG TERM GOAL #2   Title decrease pain 50%   Status Partially Met   PT LONG TERM GOAL #3   Title increase lumbar ROM 50%   Status Partially Met               Plan - 07/12/15 0851    Clinical Impression Statement Pt 14 minutes late for PT treatment. Reports that hew has been being better overall, Performed all interventions this date without c/o increase pain. Does reports that he felt like he started to have a back spasm with isometric core strengthening interventions.   Pt will benefit from skilled therapeutic intervention in order to improve on the following deficits Decreased strength;Decreased range of motion;Impaired flexibility;Increased muscle spasms;Pain   Rehab Potential Good   PT Frequency 2x / week   PT Duration 8 weeks   PT Treatment/Interventions Moist Heat;Electrical Stimulation;ADLs/Self Care Home Management;Therapeutic activities;Therapeutic exercise;Manual techniques;Patient/family education;Iontophoresis 62m/ml Dexamethasone;Traction  PT Next Visit Plan Continue with McKenzie exercises SI stability, and traction        Problem List There are no active problems to display for this patient.   Scot Jun, PTA  07/12/2015, 8:53 AM  [place], [place], [address] Phone: [phone]   Fax:  [phone]  Name: Kenneth Bullock MRN: [id number] Date of Birth: Oct 27, 1965

## 2015-07-14 ENCOUNTER — Ambulatory Visit: Payer: Managed Care, Other (non HMO) | Admitting: Physical Therapy

## 2015-07-14 ENCOUNTER — Encounter: Payer: Self-pay | Admitting: Physical Therapy

## 2015-07-14 DIAGNOSIS — M5126 Other intervertebral disc displacement, lumbar region: Secondary | ICD-10-CM

## 2015-07-14 DIAGNOSIS — M545 Low back pain, unspecified: Secondary | ICD-10-CM

## 2015-07-14 DIAGNOSIS — M546 Pain in thoracic spine: Secondary | ICD-10-CM

## 2015-07-14 DIAGNOSIS — M5136 Other intervertebral disc degeneration, lumbar region: Secondary | ICD-10-CM

## 2015-07-14 NOTE — Therapy (Signed)
[place], [place], [address] Phone: [phone]   Fax:  [phone]  Physical Therapy Treatment  Patient Details  Name: Kenneth Bullock MRN: [id number] Date of Birth: [birth year] Referring Provider: Lynann Bologna  Encounter Date: 07/14/2015      PT End of Session - 07/14/15 0857    Visit Number 9   Date for PT Re-Evaluation 08/13/15   PT Start Time 0814   PT Stop Time 0910   PT Time Calculation (min) 56 min   Activity Tolerance Patient tolerated treatment well   Behavior During Therapy [facility] for tasks assessed/performed      History reviewed. No pertinent past medical history.  Past Surgical History  Procedure Laterality Date  . Toe surgery      There were no vitals filed for this visit.  Visit Diagnosis:  Bilateral low back pain without sciatica  Bulging lumbar disc  Bilateral thoracic back pain      Subjective Assessment - 07/14/15 0818    Subjective "I feel good, about a 3 today"   Currently in Pain? Yes   Pain Score 3                          [facility] Adult PT Treatment/Exercise - 07/14/15 0001    Lumbar Exercises: Aerobic   Stationary Bike NuStep L5 x6 min    Lumbar Exercises: Machines for Strengthening   Leg Press 60# 2x 15; heel raises 2x15   Lumbar Exercises: Standing   Other Standing Lumbar Exercises Standing straight arm pulldown #55 2x15; Standing AR press #35 x10   Lumbar Exercises: Seated   Other Seated Lumbar Exercises Lat pull downs #35 2x15 Rows #35 2x15   Lumbar Exercises: Prone   Other Prone Lumbar Exercises Prone lying ~67mn, Prone on elbow, ~3 min, Prone push ups 2x10   Other Prone Lumbar Exercises Plank 20 sec x2   Modalities   Modalities Traction;Moist Heat   Moist Heat Therapy   Number Minutes Moist Heat 15 Minutes   Moist Heat Location Lumbar Spine   Traction   Type of Traction Lumbar   Max (lbs) 86   Time 15                   PT Short Term Goals - 06/28/15 0844    PT SHORT TERM GOAL #1   Title independent with initial HEP   Status Achieved           PT Long Term Goals - 06/30/15 08938   PT LONG TERM GOAL #2   Title decrease pain 50%   Status Partially Met   PT LONG TERM GOAL #3   Title increase lumbar ROM 50%   Status Partially Met               Plan - 07/14/15 0857    Clinical Impression Statement Again Pt 14 minutes late, reports that he feels better overall and performed all exercises well without subjective c/o increase pain. Pt tolerated additional weight for some interventions.   Pt will benefit from skilled therapeutic intervention in order to improve on the following deficits Decreased strength;Decreased range of motion;Impaired flexibility;Increased muscle spasms;Pain   Rehab Potential Good   PT Frequency 2x / week   PT Duration 8 weeks   PT Treatment/Interventions Moist Heat;Electrical Stimulation;ADLs/Self Care Home Management;Therapeutic activities;Therapeutic exercise;Manual techniques;Patient/family education;Iontophoresis '4mg'$ /ml Dexamethasone;Traction   PT Next Visit Plan Continue  with Mekenzie exercises SI stibility, and traction        Problem List There are no active problems to display for this patient.   Scot Jun, PTA  07/14/2015, 8:59 AM  [address] [place], [place], [address] Phone: [phone]   Fax:  [phone]  Name: ARRINGTON YOHE MRN: [id number] Date of Birth: [birth year]

## 2015-07-19 ENCOUNTER — Ambulatory Visit: Payer: Managed Care, Other (non HMO) | Admitting: Physical Therapy

## 2015-07-19 ENCOUNTER — Encounter: Payer: Self-pay | Admitting: Physical Therapy

## 2015-07-19 DIAGNOSIS — M545 Low back pain, unspecified: Secondary | ICD-10-CM

## 2015-07-19 DIAGNOSIS — M546 Pain in thoracic spine: Secondary | ICD-10-CM

## 2015-07-19 DIAGNOSIS — M5136 Other intervertebral disc degeneration, lumbar region: Secondary | ICD-10-CM

## 2015-07-19 DIAGNOSIS — M5126 Other intervertebral disc displacement, lumbar region: Secondary | ICD-10-CM

## 2015-07-19 NOTE — Therapy (Signed)
[facility] [address] [place], [place], [address] Phone: [phone]   Fax:  [phone]  Physical Therapy Treatment  Patient Details  Name: TYSEAN VANDERVLIET MRN: [id number] Date of Birth: 25-Apr-1966 Referring Provider: Yevette Edwards  Encounter Date: 07/19/2015      PT End of Session - 07/19/15 0844    Visit Number 10   Date for PT Re-Evaluation 08/13/15   PT Start Time 0759   PT Stop Time 0858   PT Time Calculation (min) 59 min   Activity Tolerance Patient tolerated treatment well   Behavior During Therapy [facility] for tasks assessed/performed      History reviewed. No pertinent past medical history.  Past Surgical History  Procedure Laterality Date  . Toe surgery      There were no vitals filed for this visit.  Visit Diagnosis:  Bilateral low back pain without sciatica  Bilateral thoracic back pain  Bulging lumbar disc      Subjective Assessment - 07/19/15 0759    Subjective "Im feeling good man, I say about a 2, Sitting down kind hurt still have that pain on the side"   Currently in Pain? Yes   Pain Score 2    Pain Location Back   Pain Orientation Left            [facility] PT Assessment - 07/19/15 0001    AROM   Overall AROM Comments Lumbar ROM was decreased 25%,.  All AROM of the trunk caused little pain                      [facility] Adult PT Treatment/Exercise - 07/19/15 0001    Lumbar Exercises: Aerobic   Stationary Bike NuStep L6 x6 min    Lumbar Exercises: Machines for Strengthening   Cybex Knee Extension #20 2x15    Cybex Knee Flexion #45 2x15    Leg Press 70# x15, #90 x15; heel raises 2x15   Lumbar Exercises: Standing   Other Standing Lumbar Exercises Standing hip ext #10 2x15   Other Standing Lumbar Exercises Standing straight arm pull down  & rev grip rows #45 2x15;    Lumbar Exercises: Seated   Other Seated Lumbar Exercises Lat pull downs #45 2x15 Rows #45 2x15   Lumbar  Exercises: Prone   Other Prone Lumbar Exercises Prone lying ~39min, Prone on elbow, ~3 min, Prone push ups 2x10   Other Prone Lumbar Exercises Plank 20 sec x2   Modalities   Modalities Traction;Moist Heat   Moist Heat Therapy   Number Minutes Moist Heat 15 Minutes   Moist Heat Location Lumbar Spine   Traction   Type of Traction Lumbar   Max (lbs) 87   Time 12                  PT Short Term Goals - 06/28/15 0844    PT SHORT TERM GOAL #1   Title independent with initial HEP   Status Achieved           PT Long Term Goals - 07/19/15 0845    PT LONG TERM GOAL #3   Title increase lumbar ROM 50%   Status Achieved               Plan - 07/19/15 0844    Clinical Impression Statement Tolerated treatment well and reports overall improvement. Pt also has increase lumbar ROM in all directions with little pain.   Pt will  benefit from skilled therapeutic intervention in order to improve on the following deficits Decreased strength;Decreased range of motion;Impaired flexibility;Increased muscle spasms;Pain   Rehab Potential Good   PT Frequency 2x / week   PT Duration 8 weeks   PT Treatment/Interventions Moist Heat;Electrical Stimulation;ADLs/Self Care Home Management;Therapeutic activities;Therapeutic exercise;Manual techniques;Patient/family education;Iontophoresis /ml Dexamethasone;Traction   PT Next Visit Plan Continue with Mekenzie exercises SI stibility, and traction        Problem List There are no active problems to display for this patient.   Grayce Sessions, PTA  07/19/2015, 8:46 AM  [facility] [place] [address] [place], [place], [address] Phone: [phone]   Fax:  [phone]  Name: JOESPH MARCY MRN: [id number] Date of Birth: 04/14/1966

## 2015-07-21 ENCOUNTER — Ambulatory Visit: Payer: Managed Care, Other (non HMO) | Admitting: Physical Therapy

## 2015-07-21 ENCOUNTER — Encounter: Payer: Self-pay | Admitting: Physical Therapy

## 2015-07-21 DIAGNOSIS — M5136 Other intervertebral disc degeneration, lumbar region: Secondary | ICD-10-CM

## 2015-07-21 DIAGNOSIS — M546 Pain in thoracic spine: Secondary | ICD-10-CM

## 2015-07-21 DIAGNOSIS — M5126 Other intervertebral disc displacement, lumbar region: Secondary | ICD-10-CM

## 2015-07-21 DIAGNOSIS — M545 Low back pain, unspecified: Secondary | ICD-10-CM

## 2015-07-21 NOTE — Therapy (Signed)
[facility] [address] [place], [place], [address] Phone: [phone]   Fax:  [phone]  Physical Therapy Treatment  Patient Details  Name: Kenneth Bullock MRN: [id number] Date of Birth: July 08, 1965 Referring Provider: Yevette Edwards  Encounter Date: 07/21/2015      PT End of Session - 07/21/15 1014    Visit Number 11   Date for PT Re-Evaluation 08/13/15   PT Start Time 0930   PT Stop Time 1029   PT Time Calculation (min) 59 min   Activity Tolerance Patient tolerated treatment well   Behavior During Therapy [facility] for tasks assessed/performed      History reviewed. No pertinent past medical history.  Past Surgical History  Procedure Laterality Date  . Toe surgery      There were no vitals filed for this visit.  Visit Diagnosis:  Bilateral low back pain without sciatica  Bilateral thoracic back pain  Bulging lumbar disc      Subjective Assessment - 07/21/15 0929    Subjective "Im good man, Im doing good about a 2 today"   Currently in Pain? Yes   Pain Score 2    Pain Location Back                         [facility] Adult PT Treatment/Exercise - 07/21/15 0001    Lumbar Exercises: Aerobic   Stationary Bike NuStep L6 x5 min    Elliptical I10 R5 x4 min   Lumbar Exercises: Machines for Strengthening   Cybex Knee Extension #20 2x15    Cybex Knee Flexion #45 2x15    Leg Press 80# 2x15; heel raises 2x15   Lumbar Exercises: Standing   Other Standing Lumbar Exercises Standing hip ext #10 2x15   Other Standing Lumbar Exercises Standing straight arm pulldown  & rev grip rows #45 2x15;    Lumbar Exercises: Seated   Other Seated Lumbar Exercises Lat pull downs #45 2x15   Lumbar Exercises: Prone   Other Prone Lumbar Exercises Prone lying ~28min, Prone on elbow, ~3 min, Prone push ups 2x10   Other Prone Lumbar Exercises Plank 20 sec x2   Modalities   Modalities Traction;Moist Heat   Moist Heat Therapy    Number Minutes Moist Heat 15 Minutes   Moist Heat Location Lumbar Spine   Traction   Type of Traction Lumbar   Max (lbs) 90   Time 15                  PT Short Term Goals - 06/28/15 0844    PT SHORT TERM GOAL #1   Title independent with initial HEP   Status Achieved           PT Long Term Goals - 07/19/15 0845    PT LONG TERM GOAL #3   Title increase lumbar ROM 50%   Status Achieved               Plan - 07/21/15 1014    Clinical Impression Statement Continues to progress and do well, reports decrease pain overall.   Pt will benefit from skilled therapeutic intervention in order to improve on the following deficits Decreased strength;Decreased range of motion;Impaired flexibility;Increased muscle spasms;Pain   Rehab Potential Good   PT Frequency 2x / week   PT Duration 8 weeks   PT Treatment/Interventions Moist Heat;Electrical Stimulation;ADLs/Self Care Home Management;Therapeutic activities;Therapeutic exercise;Manual techniques;Patient/family education;Iontophoresis /ml Dexamethasone;Traction   PT Next Visit  Plan Continue with Mekenzie exercises SI stibility, and traction        Problem List There are no active problems to display for this patient.   Grayce Sessions, PTA  07/21/2015, 10:15 AM  [facility] [place] [facility] [address] [place], [place], [address] Phone: [phone]   Fax:  [phone]  Name: Kenneth Bullock MRN: [id number] Date of Birth: May 28, 1966

## 2015-07-27 ENCOUNTER — Ambulatory Visit: Payer: Managed Care, Other (non HMO) | Admitting: Physical Therapy

## 2015-07-27 ENCOUNTER — Encounter: Payer: Self-pay | Admitting: Physical Therapy

## 2015-07-27 DIAGNOSIS — M546 Pain in thoracic spine: Secondary | ICD-10-CM

## 2015-07-27 DIAGNOSIS — M545 Low back pain, unspecified: Secondary | ICD-10-CM

## 2015-07-27 DIAGNOSIS — M5136 Other intervertebral disc degeneration, lumbar region: Secondary | ICD-10-CM

## 2015-07-27 DIAGNOSIS — M5126 Other intervertebral disc displacement, lumbar region: Secondary | ICD-10-CM

## 2015-07-27 NOTE — Therapy (Signed)
[facility] [address] [place], [place], [address] Phone: [phone]   Fax:  [phone]  Physical Therapy Treatment  Patient Details  Name: ATHA MCBAIN MRN: [id number] Date of Birth: 20-Jun-1965 Referring Provider: Yevette Edwards  Encounter Date: 07/27/2015      PT End of Session - 07/27/15 0844    Visit Number 12   Date for PT Re-Evaluation 08/13/15   PT Start Time 0810   PT Stop Time 0900   PT Time Calculation (min) 50 min   Activity Tolerance Patient tolerated treatment well   Behavior During Therapy [facility] for tasks assessed/performed      History reviewed. No pertinent past medical history.  Past Surgical History  Procedure Laterality Date  . Toe surgery      There were no vitals filed for this visit.  Visit Diagnosis:  Bilateral low back pain without sciatica  Bilateral thoracic back pain  Bulging lumbar disc      Subjective Assessment - 07/27/15 0809    Subjective "Im good"   Currently in Pain? Yes   Pain Score 2    Pain Location Back   Pain Orientation Left                         [facility] Adult PT Treatment/Exercise - 07/27/15 0001    Lumbar Exercises: Aerobic   Elliptical I10 R5 x6 min   Lumbar Exercises: Machines for Strengthening   Leg Press 80# 2x15; heel raises 2x15   Lumbar Exercises: Standing   Other Standing Lumbar Exercises Standing AR press #35 2x10   Lumbar Exercises: Seated   Other Seated Lumbar Exercises Rows #55 2x10; Lat pulls #55 2x10    Lumbar Exercises: Prone   Other Prone Lumbar Exercises Prone lying ~60min, Prone on elbow, ~3 min, Prone push ups 2x10                  PT Short Term Goals - 06/28/15 0844    PT SHORT TERM GOAL #1   Title independent with initial HEP   Status Achieved           PT Long Term Goals - 07/19/15 0845    PT LONG TERM GOAL #3   Title increase lumbar ROM 50%   Status Achieved               Plan -  07/27/15 0845    Clinical Impression Statement Pt went to MD last Friday, Things are looking good. Pt has script to continue PT. Pt 10 min late for today's PT session, tolerated all exercises well reports less pain overall.   Pt will benefit from skilled therapeutic intervention in order to improve on the following deficits Decreased strength;Decreased range of motion;Impaired flexibility;Increased muscle spasms;Pain   Rehab Potential Good   PT Frequency 2x / week   PT Duration 8 weeks   PT Treatment/Interventions Moist Heat;Electrical Stimulation;ADLs/Self Care Home Management;Therapeutic activities;Therapeutic exercise;Manual techniques;Patient/family education;Iontophoresis /ml Dexamethasone;Traction   PT Next Visit Plan Continue with Mekenzie exercises SI stability, and traction        Problem List There are no active problems to display for this patient.   Grayce Sessions, PTA  07/27/2015, 8:47 AM  [facility] [place] [facility] [address] [place], [place], [address] Phone: [phone]   Fax:  [phone]  Name: EVERADO PILLSBURY MRN: [id number] Date of Birth: 1965/07/04

## 2015-07-29 ENCOUNTER — Ambulatory Visit: Payer: Self-pay | Attending: Internal Medicine | Admitting: Physical Therapy

## 2015-07-29 DIAGNOSIS — M5126 Other intervertebral disc displacement, lumbar region: Secondary | ICD-10-CM | POA: Insufficient documentation

## 2015-07-29 DIAGNOSIS — M545 Low back pain: Secondary | ICD-10-CM | POA: Insufficient documentation

## 2015-07-29 DIAGNOSIS — M546 Pain in thoracic spine: Secondary | ICD-10-CM | POA: Insufficient documentation

## 2015-08-02 ENCOUNTER — Encounter: Payer: Self-pay | Admitting: Physical Therapy

## 2015-08-02 ENCOUNTER — Ambulatory Visit: Payer: Managed Care, Other (non HMO) | Admitting: Physical Therapy

## 2015-08-02 DIAGNOSIS — M5136 Other intervertebral disc degeneration, lumbar region: Secondary | ICD-10-CM

## 2015-08-02 DIAGNOSIS — M545 Low back pain, unspecified: Secondary | ICD-10-CM

## 2015-08-02 DIAGNOSIS — M546 Pain in thoracic spine: Secondary | ICD-10-CM

## 2015-08-02 DIAGNOSIS — M5126 Other intervertebral disc displacement, lumbar region: Secondary | ICD-10-CM

## 2015-08-02 NOTE — Therapy (Signed)
[facility] [address] [place], [place], [address] Phone: [phone]   Fax:  [phone]  Physical Therapy Treatment  Patient Details  Name: Kenneth Bullock MRN: [id number] Date of Birth: Jul 12, 1965 Referring Provider: Yevette Edwardsumonski  Encounter Date: 08/02/2015      PT End of Session - 08/02/15 0844    Visit Number 13   Date for PT Re-Evaluation 08/13/15   PT Start Time 0815   PT Stop Time 0900   PT Time Calculation (min) 45 min   Activity Tolerance Patient tolerated treatment well   Behavior [facility] Therapy [facility] for tasks assessed/performed      History reviewed. No pertinent past medical history.  Past Surgical History  Procedure Laterality Date  . Toe surgery      There were no vitals filed for this visit.  Visit Diagnosis:  Bilateral low back pain without sciatica  Bilateral thoracic back pain  Bulging lumbar disc      Subjective Assessment - 08/02/15 0814    Subjective "Im warm, I walked and did a little bit of walking, I jogged "   Currently in Pain? Yes   Pain Score 1    Pain Location Back   Pain Orientation Left                         [facility] Adult PT Treatment/Exercise - 08/02/15 0001    Lumbar Exercises: Machines for Strengthening   Leg Press 80# 2x15; heel raises 2x15   Lumbar Exercises: Standing   Other Standing Lumbar Exercises Standing hip ext #10 2x15; Standing rev grip rows #45 2x15; Straight arm pull downs 2x15    Other Standing Lumbar Exercises Standing AR press #35 2x10   Lumbar Exercises: Seated   Other Seated Lumbar Exercises Rows #55 2x15; Lat pulls #55 2x15    Modalities   Modalities Traction;Moist Heat   Moist Heat Therapy   Number Minutes Moist Heat 15 Minutes   Moist Heat Location Lumbar Spine   Traction   Type of Traction Lumbar   Max (lbs) 90   Time 15                  PT Short Term Goals - 06/28/15 0844    PT SHORT TERM GOAL #1   Title  independent with initial HEP   Status Achieved           PT Long Term Goals - 08/02/15 0825    PT LONG TERM GOAL #1   Title understand proper posture and body mechanics   Status Achieved   PT LONG TERM GOAL #2   Title decrease pain 50%   Status Achieved   PT LONG TERM GOAL #3   Title increase lumbar ROM 50%   Status Achieved   PT LONG TERM GOAL #4   Title tolerate standing 30 minutes   Status Achieved               Plan - 08/02/15 0844    Clinical Impression Statement Pt 15 minutes late for PT treatment. Tolerated all interventions well and completed all goals. Pt reports that he has not tried to play basketball at his normal level. Stated that he would like to play basketball before he is D/C from PT. Pt  stated that he will try to play basketball before his next appointment.   PT Next Visit Plan D/C PT if pt is doing well.  Problem List There are no active problems to display for this patient.   Grayce Sessions, PTA  08/02/2015, 8:49 AM  [facility] [place] [facility] [address] [place], [place], [address] Phone: [phone]   Fax:  [phone]  Name: Kenneth Bullock MRN: [id number] Date of Birth: 28-Aug-1965

## 2015-08-04 ENCOUNTER — Ambulatory Visit: Payer: Managed Care, Other (non HMO) | Admitting: Physical Therapy

## 2015-08-04 ENCOUNTER — Encounter: Payer: Self-pay | Admitting: Physical Therapy

## 2015-08-04 DIAGNOSIS — M545 Low back pain, unspecified: Secondary | ICD-10-CM

## 2015-08-04 DIAGNOSIS — M546 Pain in thoracic spine: Secondary | ICD-10-CM

## 2015-08-04 DIAGNOSIS — M5136 Other intervertebral disc degeneration, lumbar region: Secondary | ICD-10-CM

## 2015-08-04 DIAGNOSIS — M5126 Other intervertebral disc displacement, lumbar region: Secondary | ICD-10-CM

## 2015-08-04 NOTE — Therapy (Signed)
[facility] [address] [place], [place], [address] Phone: [phone]   Fax:  [phone]  Physical Therapy Treatment  Patient Details  Name: Kenneth Bullock MRN: [id number] Date of Birth: [birth year]ock Referring Provider: Yevette Edwardsumonski  Encounter Date: 08/04/2015      PT End of Session - 08/04/15 0846    Visit Number 14   Date for PT Re-Evaluation 08/13/15   PT Start Time 0805   PT Stop Time 0900   PT Time Calculation (min) 55 min   Activity Tolerance Patient tolerated treatment well   Behavior During Therapy [facility] for tasks assessed/performed      History reviewed. No pertinent past medical history.  Past Surgical History  Procedure Laterality Date  . Toe surgery      There were no vitals filed for this visit.  Visit Diagnosis:  Bilateral low back pain without sciatica  Bilateral thoracic back pain  Bulging lumbar disc      Subjective Assessment - 08/04/15 0805    Subjective "I played basketball, It was sore afterwards but Im all right"   Currently in Pain? No/denies   Pain Score 0-No pain                         [facility] Adult PT Treatment/Exercise - 08/04/15 0001    Lumbar Exercises: Aerobic   Elliptical I10 R5 x6 min   Lumbar Exercises: Machines for Strengthening   Cybex Knee Extension #20 2x15    Cybex Knee Flexion #45 2x15    Leg Press 80# 2x15; heel raises 2x15   Lumbar Exercises: Standing   Other Standing Lumbar Exercises Standing AR press #35 2x10; Straight arm pull downs #45 2x15    Lumbar Exercises: Seated   Other Seated Lumbar Exercises Rows #55 2x15; Lat pulls #55 2x15    Lumbar Exercises: Prone   Other Prone Lumbar Exercises Prone lying ~283min, Prone on elbow, ~3 min, Prone push ups 2x10   Other Prone Lumbar Exercises Plank 20 sec x2   Modalities   Modalities Traction;Moist Heat   Moist Heat Therapy   Number Minutes Moist Heat 15 Minutes   Moist Heat Location Lumbar Spine    Traction   Type of Traction Lumbar   Max (lbs) 90   Time 15                  PT Short Term Goals - 06/28/15 0844    PT SHORT TERM GOAL #1   Title independent with initial HEP   Status Achieved           PT Long Term Goals - 08/02/15 0825    PT LONG TERM GOAL #1   Title understand proper posture and body mechanics   Status Achieved   PT LONG TERM GOAL #2   Title decrease pain 50%   Status Achieved   PT LONG TERM GOAL #3   Title increase lumbar ROM 50%   Status Achieved   PT LONG TERM GOAL #4   Title tolerate standing 30 minutes   Status Achieved               Plan - 08/04/15 0847    Clinical Impression Statement Pt 5 minutes late for today's session, pt has progressed an completed all goals. Pt reports that he would like to get another x-ray of his back before PT D/C.   PT Next Visit Plan Spoke with lead PT  will place pt on a 2 week hold.        Problem List There are no active problems to display for this patient.   Grayce Sessions, PTA  08/04/2015, 8:51 AM  [facility] [place] [facility] [address] [place], [place], [address] Phone: [phone]   Fax:  [phone]  Name: Kenneth Bullock MRN: [id number] Date of Birth: [birth year]

## 2015-09-07 ENCOUNTER — Other Ambulatory Visit: Payer: Self-pay | Admitting: Family Medicine

## 2015-09-07 DIAGNOSIS — R053 Chronic cough: Secondary | ICD-10-CM | POA: Insufficient documentation

## 2015-09-07 DIAGNOSIS — R05 Cough: Secondary | ICD-10-CM | POA: Insufficient documentation

## 2015-09-07 DIAGNOSIS — K219 Gastro-esophageal reflux disease without esophagitis: Secondary | ICD-10-CM

## 2015-09-07 DIAGNOSIS — G510 Bell's palsy: Secondary | ICD-10-CM | POA: Insufficient documentation

## 2015-09-07 DIAGNOSIS — R9431 Abnormal electrocardiogram [ECG] [EKG]: Secondary | ICD-10-CM | POA: Insufficient documentation

## 2015-09-07 NOTE — Telephone Encounter (Signed)
Last CPE 07/20/2011. Antonio DillonEmily Leonard, CMA

## 2016-04-19 ENCOUNTER — Other Ambulatory Visit: Payer: Self-pay | Admitting: Student

## 2016-04-19 DIAGNOSIS — M1712 Unilateral primary osteoarthritis, left knee: Secondary | ICD-10-CM

## 2016-04-19 DIAGNOSIS — M25562 Pain in left knee: Secondary | ICD-10-CM

## 2016-05-03 ENCOUNTER — Ambulatory Visit
Admission: RE | Admit: 2016-05-03 | Discharge: 2016-05-03 | Disposition: A | Discharge status: Home / Self Care | Payer: Managed Care, Other (non HMO) | Source: Ambulatory Visit | Attending: Student | Admitting: Student

## 2016-05-03 DIAGNOSIS — M11262 Other chondrocalcinosis, left knee: Secondary | ICD-10-CM | POA: Insufficient documentation

## 2016-05-03 DIAGNOSIS — S8992XA Unspecified injury of left lower leg, initial encounter: Secondary | ICD-10-CM | POA: Diagnosis not present

## 2016-05-03 DIAGNOSIS — X58XXXA Exposure to other specified factors, initial encounter: Secondary | ICD-10-CM | POA: Diagnosis not present

## 2016-05-03 DIAGNOSIS — M25562 Pain in left knee: Secondary | ICD-10-CM | POA: Diagnosis present

## 2016-05-03 DIAGNOSIS — M1712 Unilateral primary osteoarthritis, left knee: Secondary | ICD-10-CM

## 2016-05-16 ENCOUNTER — Encounter: Payer: Self-pay | Admitting: *Deleted

## 2016-05-17 ENCOUNTER — Encounter
Admission: RE | Disposition: A | Discharge status: Home / Self Care | Payer: Self-pay | Source: Ambulatory Visit | Attending: Surgery

## 2016-05-17 ENCOUNTER — Ambulatory Visit: Payer: Managed Care, Other (non HMO) | Admitting: Anesthesiology

## 2016-05-17 ENCOUNTER — Ambulatory Visit
Admission: RE | Admit: 2016-05-17 | Discharge: 2016-05-17 | Disposition: A | Discharge status: Home / Self Care | Payer: Managed Care, Other (non HMO) | Source: Ambulatory Visit | Attending: Surgery | Admitting: Surgery

## 2016-05-17 DIAGNOSIS — M1712 Unilateral primary osteoarthritis, left knee: Secondary | ICD-10-CM | POA: Insufficient documentation

## 2016-05-17 DIAGNOSIS — S83282A Other tear of lateral meniscus, current injury, left knee, initial encounter: Secondary | ICD-10-CM | POA: Diagnosis present

## 2016-05-17 DIAGNOSIS — K219 Gastro-esophageal reflux disease without esophagitis: Secondary | ICD-10-CM | POA: Diagnosis not present

## 2016-05-17 DIAGNOSIS — X58XXXA Exposure to other specified factors, initial encounter: Secondary | ICD-10-CM | POA: Diagnosis not present

## 2016-05-17 HISTORY — DX: Unspecified osteoarthritis, unspecified site: M19.90

## 2016-05-17 HISTORY — DX: Gastro-esophageal reflux disease without esophagitis: K21.9

## 2016-05-17 HISTORY — PX: KNEE ARTHROSCOPY: SHX127

## 2016-05-17 SURGERY — ARTHROSCOPY, KNEE
Anesthesia: General | Site: Knee | Laterality: Left | Wound class: Clean

## 2016-05-17 MED ORDER — HYDROCODONE-ACETAMINOPHEN 5-325 MG PO TABS
1.0000 | ORAL_TABLET | ORAL | Status: DC | PRN
Start: 2016-05-17 — End: 2016-05-17

## 2016-05-17 MED ORDER — ACETAMINOPHEN 10 MG/ML IV SOLN
1000.0000 mg | Freq: Once | INTRAVENOUS | Status: AC
  Administered 2016-05-17: 1000 mg via INTRAVENOUS

## 2016-05-17 MED ORDER — METOCLOPRAMIDE HCL 5 MG/ML IJ SOLN: 5.0000 mg | Freq: Three times a day (TID) | INTRAMUSCULAR | Status: DC | PRN

## 2016-05-17 MED ORDER — ONDANSETRON HCL 4 MG/2ML IJ SOLN
INTRAMUSCULAR | Status: DC | PRN
  Administered 2016-05-17: 4 mg via INTRAVENOUS

## 2016-05-17 MED ORDER — CEFAZOLIN SODIUM-DEXTROSE 2-4 GM/100ML-% IV SOLN
2.0000 g | Freq: Once | INTRAVENOUS | Status: AC
  Administered 2016-05-17: 2 g via INTRAVENOUS

## 2016-05-17 MED ORDER — MIDAZOLAM HCL 5 MG/5ML IJ SOLN
INTRAMUSCULAR | Status: DC | PRN
  Administered 2016-05-17: 2 mg via INTRAVENOUS

## 2016-05-17 MED ORDER — OXYCODONE HCL 5 MG PO TABS
5.0000 mg | ORAL_TABLET | Freq: Once | ORAL | Status: AC | PRN
  Administered 2016-05-17: 5 mg via ORAL

## 2016-05-17 MED ORDER — LIDOCAINE HCL (CARDIAC) 20 MG/ML IV SOLN
INTRAVENOUS | Status: DC | PRN
  Administered 2016-05-17: 50 mg via INTRATRACHEAL

## 2016-05-17 MED ORDER — FENTANYL CITRATE (PF) 100 MCG/2ML IJ SOLN
INTRAMUSCULAR | Status: DC | PRN
  Administered 2016-05-17: 2 ug via INTRAVENOUS

## 2016-05-17 MED ORDER — BUPIVACAINE-EPINEPHRINE 0.25% -1:200000 IJ SOLN
INTRAMUSCULAR | Status: DC | PRN
  Administered 2016-05-17: 20 mL

## 2016-05-17 MED ORDER — GLYCOPYRROLATE 0.2 MG/ML IJ SOLN
INTRAMUSCULAR | Status: DC | PRN
  Administered 2016-05-17: 0.2 mg via INTRAVENOUS

## 2016-05-17 MED ORDER — LACTATED RINGERS IV SOLN
INTRAVENOUS | Status: DC
  Administered 2016-05-17: 13:00:00 via INTRAVENOUS

## 2016-05-17 MED ORDER — ONDANSETRON HCL 4 MG/2ML IJ SOLN: 4.0000 mg | Freq: Once | INTRAMUSCULAR | Status: DC | PRN

## 2016-05-17 MED ORDER — ONDANSETRON HCL 4 MG PO TABS: 4.0000 mg | ORAL_TABLET | Freq: Four times a day (QID) | ORAL | Status: DC | PRN

## 2016-05-17 MED ORDER — METOCLOPRAMIDE HCL 5 MG PO TABS
5.0000 mg | ORAL_TABLET | Freq: Three times a day (TID) | ORAL | Status: DC | PRN
Start: 2016-05-17 — End: 2016-05-17

## 2016-05-17 MED ORDER — FENTANYL CITRATE (PF) 100 MCG/2ML IJ SOLN: 25.0000 ug | INTRAMUSCULAR | Status: DC | PRN

## 2016-05-17 MED ORDER — PROPOFOL 10 MG/ML IV BOLUS
INTRAVENOUS | Status: DC | PRN
  Administered 2016-05-17: 150 mg via INTRAVENOUS
  Administered 2016-05-17: 50 mg via INTRAVENOUS

## 2016-05-17 MED ORDER — POTASSIUM CHLORIDE IN NACL 20-0.9 MEQ/L-% IV SOLN: INTRAVENOUS | Status: DC

## 2016-05-17 MED ORDER — BUPIVACAINE HCL (PF) 0.5 % IJ SOLN
INTRAMUSCULAR | Status: DC | PRN
  Administered 2016-05-17: 30 mL

## 2016-05-17 MED ORDER — OXYCODONE HCL 5 MG/5ML PO SOLN: 5.0000 mg | Freq: Once | ORAL | Status: AC | PRN

## 2016-05-17 MED ORDER — LIDOCAINE-EPINEPHRINE (PF) 1 %-1:200000 IJ SOLN
INTRAMUSCULAR | Status: DC | PRN
  Administered 2016-05-17: 30 mL

## 2016-05-17 MED ORDER — HYDROCODONE-ACETAMINOPHEN 5-325 MG PO TABS: 1.0000 | ORAL_TABLET | Freq: Four times a day (QID) | ORAL | 0 refills | Status: DC | PRN

## 2016-05-17 MED ORDER — SCOPOLAMINE 1 MG/3DAYS TD PT72
1.0000 | MEDICATED_PATCH | Freq: Once | TRANSDERMAL | Status: DC
  Administered 2016-05-17: 1.5 mg via TRANSDERMAL

## 2016-05-17 MED ORDER — ONDANSETRON HCL 4 MG/2ML IJ SOLN: 4.0000 mg | Freq: Four times a day (QID) | INTRAMUSCULAR | Status: DC | PRN

## 2016-05-17 MED ORDER — DEXAMETHASONE SODIUM PHOSPHATE 4 MG/ML IJ SOLN
INTRAMUSCULAR | Status: DC | PRN
  Administered 2016-05-17: 4 mg via INTRAVENOUS

## 2016-05-17 SURGICAL SUPPLY — 30 items
BANDAGE ELASTIC 6 LF NS (GAUZE/BANDAGES/DRESSINGS) ×3 IMPLANT
BLADE FULL RADIUS 3.5 (BLADE) ×3 IMPLANT
BUR ACROMIONIZER 4.0 (BURR) IMPLANT
CHLORAPREP W/TINT 26ML (MISCELLANEOUS) ×3 IMPLANT
COVER LIGHT HANDLE UNIVERSAL (MISCELLANEOUS) ×6 IMPLANT
CUFF TOURN SGL QUICK 30 (MISCELLANEOUS)
CUFF TOURN SGL QUICK 34 (TOURNIQUET CUFF)
CUFF TRNQT CYL 34X4X40X1 (TOURNIQUET CUFF) IMPLANT
CUFF TRNQT CYL LO 30X4X (MISCELLANEOUS) IMPLANT
DRAPE IMP U-DRAPE 54X76 (DRAPES) ×3 IMPLANT
GAUZE SPONGE 4X4 12PLY STRL (GAUZE/BANDAGES/DRESSINGS) ×3 IMPLANT
GLOVE BIO SURGEON STRL SZ8 (GLOVE) ×6 IMPLANT
GLOVE INDICATOR 8.0 STRL GRN (GLOVE) ×6 IMPLANT
GOWN STRL REUS W/ TWL LRG LVL3 (GOWN DISPOSABLE) ×1 IMPLANT
GOWN STRL REUS W/ TWL XL LVL3 (GOWN DISPOSABLE) ×1 IMPLANT
GOWN STRL REUS W/TWL LRG LVL3 (GOWN DISPOSABLE) ×2
GOWN STRL REUS W/TWL XL LVL3 (GOWN DISPOSABLE) ×2
IV LACTATED RINGER IRRG 3000ML (IV SOLUTION) ×4
IV LR IRRIG 3000ML ARTHROMATIC (IV SOLUTION) ×2 IMPLANT
KIT ROOM TURNOVER OR (KITS) ×3 IMPLANT
MANIFOLD 4PT FOR NEPTUNE1 (MISCELLANEOUS) ×3 IMPLANT
NEEDLE HYPO 21X1.5 SAFETY (NEEDLE) ×6 IMPLANT
PACK ARTHROSCOPY KNEE (MISCELLANEOUS) ×3 IMPLANT
STRAP BODY AND KNEE 60X3 (MISCELLANEOUS) ×3 IMPLANT
SUT PROLENE 4 0 PS 2 18 (SUTURE) ×3 IMPLANT
SUT VIC AB 2-0 CT1 27 (SUTURE)
SUT VIC AB 2-0 CT1 TAPERPNT 27 (SUTURE) IMPLANT
SYR 50ML LL SCALE MARK (SYRINGE) ×3 IMPLANT
TUBING ARTHRO INFLOW-ONLY STRL (TUBING) ×3 IMPLANT
WAND HAND CNTRL MULTIVAC 90 (MISCELLANEOUS) IMPLANT

## 2016-05-17 NOTE — H&P (Signed)
Paper H&P to be scanned into permanent record. H&P reviewed. No changes. 

## 2016-05-17 NOTE — Op Note (Signed)
05/17/2016  3:05 PM  Patient:   Antonio RiedelJames A Yanni Jr.  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  Postoperative diagnosis:   Same.  Procedure:   Arthroscopic partial lateral meniscectomy with abrasion chondroplasty of grade 3 chondromalacia femoral trochlea, left knee.  Surgeon:   Maryagnes AmosJ. Jeffrey Poggi, M.D.  Anesthesia:   General LMA.  Findings:   As above. The medial meniscus demonstrated minimal fraying centrally along the posterior portion of the meniscus but otherwise was intact. There were grade 2 chondromalacial changes involving the patella and lateral femoral condyle. The anterior and posterior cruciate ligaments both were in satisfactory condition.  Complications:   None.  EBL:   <5 cc.  Total fluids:   300 cc of crystalloid.  Tourniquet time:   None  Drains:   None  Closure:   4-0 Prolene interrupted sutures.  Brief clinical note:   The patient is a 50 year old male who sustained the above-noted injury playing basketball. His history and examination were consistent with a lateral meniscus tear, confirmed by MRI scan. The patient presents at this time for arthroscopy, debridement, and partial lateral meniscectomy.  Procedure:   The patient was brought into the operating room and lain in the supine position. After adequate general laryngeal mask anesthesia was obtained, a timeout was performed to verify the appropriate side. The patient's left knee was injected sterilely using a solution of 30 cc of 1% lidocaine and 30 cc of 0.5% Sensorcaine with epinephrine. The left lower extremity was prepped with ChloraPrep solution before being draped sterilely. Preoperative antibiotics were administered. The expected portal sites were injected with 0.5% Sensorcaine with epinephrine before the camera was placed in the anterolateral portal and instrumentation performed through the anteromedial portal. The knee was sequentially examined  beginning in the suprapatellar pouch, then progressing to the patellofemoral space, the medial gutter compartment, the notch, and finally the lateral compartment and gutter. The findings were as described above. Abundant reactive synovial tissues anteriorly were debrided using the full-radius resector in order to improve visualization. The areas of fraying along the central portion of the medial meniscus were lightly debrided using the full-radius resector. The area of tearing involving the anterior portion of the lateral meniscus was debrided back to stable margins using the up-biting basket, the side-biting basket, and the full-radius resector. Some probing demonstrated excellent stability. There also was an area of degenerative fraying posterolaterally which was debrided to stable margins using the full-radius resector. The areas of loose articular cartilage in the femoral trochlea were debrided back to stable margins using the full-radius resector as well. The instruments were removed from the joint after suctioning the excess fluid. The portal sites were closed using 4-0 Prolene interrupted sutures before a sterile bulky dressing was applied to the knee. The patient was then awakened, extubated, and returned to the recovery room in satisfactory condition after tolerating the procedure well.

## 2016-05-17 NOTE — Anesthesia Postprocedure Evaluation (Signed)
Anesthesia Post Note  Patient: Antonio RiedelJames A Laforte Jr.  Procedure(s) Performed: Procedure(s) (LRB): ARTHROSCOPY KNEE (Left)  Patient location during evaluation: PACU Anesthesia Type: General Level of consciousness: awake and alert and oriented Pain management: satisfactory to patient Vital Signs Assessment: post-procedure vital signs reviewed and stable Respiratory status: spontaneous breathing, nonlabored ventilation and respiratory function stable Cardiovascular status: blood pressure returned to baseline and stable Postop Assessment: Adequate PO intake and No signs of nausea or vomiting Anesthetic complications: no    Antonio Leonard, Antonio Leonard

## 2016-05-17 NOTE — Transfer of Care (Signed)
Immediate Anesthesia Transfer of Care Note  Patient: Anette RiedelJames A Hennings Jr.  Procedure(s) Performed: Procedure(s) with comments: ARTHROSCOPY KNEE (Left) - PARTIAL LATERAL MENISECTOMY AND ABRAISION CHONDROPLASTY OF THE FEMORAL TROCHLEA  Patient Location: PACU  Anesthesia Type: General LMA  Level of Consciousness: awake, alert  and patient cooperative  Airway and Oxygen Therapy: Patient Spontanous Breathing and Patient connected to supplemental oxygen  Post-op Assessment: Post-op Vital signs reviewed, Patient's Cardiovascular Status Stable, Respiratory Function Stable, Patent Airway and No signs of Nausea or vomiting  Post-op Vital Signs: Reviewed and stable  Complications: No apparent anesthesia complications

## 2016-05-17 NOTE — Discharge Instructions (Signed)
General Anesthesia, Adult, Care After °These instructions provide you with information about caring for yourself after your procedure. Your health care provider may also give you more specific instructions. Your treatment has been planned according to current medical practices, but problems sometimes occur. Call your health care provider if you have any problems or questions after your procedure. °What can I expect after the procedure? °After the procedure, it is common to have: °· Vomiting. °· A sore throat. °· Mental slowness. °It is common to feel: °· Nauseous. °· Cold or shivery. °· Sleepy. °· Tired. °· Sore or achy, even in parts of your body where you did not have surgery. °Follow these instructions at home: °For at least 24 hours after the procedure: °· Do not: °¨ Participate in activities where you could fall or become injured. °¨ Drive. °¨ Use heavy machinery. °¨ Drink alcohol. °¨ Take sleeping pills or medicines that cause drowsiness. °¨ Make important decisions or sign legal documents. °¨ Take care of children on your own. °· Rest. °Eating and drinking °· If you vomit, drink water, juice, or soup when you can drink without vomiting. °· Drink enough fluid to keep your urine clear or pale yellow. °· Make sure you have little or no nausea before eating solid foods. °· Follow the diet recommended by your health care provider. °General instructions °· Have a responsible adult stay with you until you are awake and alert. °· Return to your normal activities as told by your health care provider. Ask your health care provider what activities are safe for you. °· Take over-the-counter and prescription medicines only as told by your health care provider. °· If you smoke, do not smoke without supervision. °· Keep all follow-up visits as told by your health care provider. This is important. °Contact a health care provider if: °· You continue to have nausea or vomiting at home, and medicines are not helpful. °· You  cannot drink fluids or start eating again. °· You cannot urinate after 8-12 hours. °· You develop a skin rash. °· You have fever. °· You have increasing redness at the site of your procedure. °Get help right away if: °· You have difficulty breathing. °· You have chest pain. °· You have unexpected bleeding. °· You feel that you are having a life-threatening or urgent problem. °This information is not intended to replace advice given to you by your health care provider. Make sure you discuss any questions you have with your health care provider. °Document Released: 08/21/2000 Document Revised: 10/18/2015 Document Reviewed: 04/29/2015 °Elsevier Interactive Patient Education © 2017 Elsevier Inc. ° °Keep dressing dry and intact.  °May shower after dressing changed on post-op day #4 (Sunday).  °Cover sutures with Band-Aids after drying off. °Apply ice frequently to knee. °Take ibuprofen 800 mg TID with meals for 7-10 days, then as necessary. °Take pain medication as prescribed or ES Tylenol when needed.  °May weight-bear as tolerated - use crutches or walker as needed. °Follow-up in 10-14 days or as scheduled. °

## 2016-05-17 NOTE — Anesthesia Procedure Notes (Signed)
Procedure Name: LMA Insertion Date/Time: 05/17/2016 2:18 PM Performed by: Andee PolesBUSH, Frannie Shedrick Pre-anesthesia Checklist: Patient identified, Emergency Drugs available, Suction available, Timeout performed and Patient being monitored Patient Re-evaluated:Patient Re-evaluated prior to inductionOxygen Delivery Method: Circle system utilized Preoxygenation: Pre-oxygenation with 100% oxygen Intubation Type: IV induction LMA: LMA inserted LMA Size: 4.0 Number of attempts: 1 Placement Confirmation: positive ETCO2 and breath sounds checked- equal and bilateral Tube secured with: Tape

## 2016-05-17 NOTE — Anesthesia Preprocedure Evaluation (Signed)
Anesthesia Evaluation  Patient identified by MRN, date of birth, ID band Patient awake    Reviewed: Allergy & Precautions, H&P , NPO status , Patient's Chart, lab work & pertinent test results  Airway Mallampati: II  TM Distance: >3 FB Neck ROM: full    Dental no notable dental hx.    Pulmonary    Pulmonary exam normal        Cardiovascular Normal cardiovascular exam     Neuro/Psych    GI/Hepatic GERD  ,  Endo/Other    Renal/GU      Musculoskeletal   Abdominal   Peds  Hematology   Anesthesia Other Findings   Reproductive/Obstetrics                             Anesthesia Physical Anesthesia Plan  ASA: II  Anesthesia Plan: General LMA   Post-op Pain Management:    Induction:   Airway Management Planned:   Additional Equipment:   Intra-op Plan:   Post-operative Plan:   Informed Consent: I have reviewed the patients History and Physical, chart, labs and discussed the procedure including the risks, benefits and alternatives for the proposed anesthesia with the patient or authorized representative who has indicated his/her understanding and acceptance.     Plan Discussed with:   Anesthesia Plan Comments:         Anesthesia Quick Evaluation

## 2016-05-18 ENCOUNTER — Encounter: Payer: Self-pay | Admitting: Surgery

## 2016-11-20 ENCOUNTER — Telehealth: Payer: Self-pay

## 2016-11-20 NOTE — Telephone Encounter (Signed)
Pre Visit Call Completed. 

## 2016-11-22 ENCOUNTER — Encounter: Payer: Self-pay | Admitting: Family Medicine

## 2016-11-22 ENCOUNTER — Ambulatory Visit (INDEPENDENT_AMBULATORY_CARE_PROVIDER_SITE_OTHER): Payer: BLUE CROSS/BLUE SHIELD | Admitting: Family Medicine

## 2016-11-22 VITALS — BP 132/82 | HR 68 | Temp 98.4°F | Resp 16 | Ht 73.0 in | Wt 216.8 lb

## 2016-11-22 DIAGNOSIS — R030 Elevated blood-pressure reading, without diagnosis of hypertension: Secondary | ICD-10-CM

## 2016-11-22 DIAGNOSIS — H18413 Arcus senilis, bilateral: Secondary | ICD-10-CM | POA: Diagnosis not present

## 2016-11-22 DIAGNOSIS — R809 Proteinuria, unspecified: Secondary | ICD-10-CM | POA: Diagnosis not present

## 2016-11-22 DIAGNOSIS — R319 Hematuria, unspecified: Secondary | ICD-10-CM | POA: Diagnosis not present

## 2016-11-22 DIAGNOSIS — H18419 Arcus senilis, unspecified eye: Secondary | ICD-10-CM | POA: Insufficient documentation

## 2016-11-22 NOTE — Patient Instructions (Addendum)
Bring [place] your labs when you bring in your urine sample. Your urine should be a first morning void.   Keep up iwht your active lifestyle.  DASH Eating Plan DASH stands for "Dietary Approaches to Stop Hypertension." The DASH eating plan is a healthy eating plan that has been shown to reduce high blood pressure (hypertension). It may also reduce your risk for type 2 diabetes, heart disease, and stroke. The DASH eating plan may also help with weight loss. What are tips for following this plan? General guidelines  Avoid eating more than 2,300 mg (milligrams) of salt (sodium) a day. If you have hypertension, you may need to reduce your sodium intake to 1,500 mg a day.  Limit alcohol intake to no more than 1 drink a day for nonpregnant women and 2 drinks a day for men. One drink equals 12 oz of beer, 5 oz of wine, or 1 oz of hard liquor.  Work with your health care provider to maintain a healthy body weight or to lose weight. Ask what an ideal weight is for you.  Get at least 30 minutes of exercise that causes your heart to beat faster (aerobic exercise) most days of the week. Activities may include walking, swimming, or biking.  Work with your health care provider or diet and nutrition specialist (dietitian) to adjust your eating plan to your individual calorie needs. Reading food labels  Check food labels for the amount of sodium per serving. Choose foods with less than 5 percent of the Daily Value of sodium. Generally, foods with less than 300 mg of sodium per serving fit into this eating plan.  To find whole grains, look for the word "whole" as the first word in the ingredient list. Shopping  Buy products labeled as "low-sodium" or "no salt added."  Buy fresh foods. Avoid canned foods and premade or frozen meals. Cooking  Avoid adding salt when cooking. Use salt-free seasonings or herbs instead of table salt or sea salt. Check with your health care provider or pharmacist before using salt  substitutes.  Do not fry foods. Cook foods using healthy methods such as baking, boiling, grilling, and broiling instead.  Cook with heart-healthy oils, such as olive, canola, soybean, or sunflower oil. Meal planning   Eat a balanced diet that includes: ? 5 or more servings of fruits and vegetables each day. At each meal, try to fill half of your plate with fruits and vegetables. ? Up to 6-8 servings of whole grains each day. ? Less than 6 oz of lean meat, poultry, or fish each day. A 3-oz serving of meat is about the same size as a deck of cards. One egg equals 1 oz. ? 2 servings of low-fat dairy each day. ? A serving of nuts, seeds, or beans 5 times each week. ? Heart-healthy fats. Healthy fats called Omega-3 fatty acids are found in foods such as flaxseeds and coldwater fish, like sardines, salmon, and mackerel.  Limit how much you eat of the following: ? Canned or prepackaged foods. ? Food that is high in trans fat, such as fried foods. ? Food that is high in saturated fat, such as fatty meat. ? Sweets, desserts, sugary drinks, and other foods with added sugar. ? Full-fat dairy products.  Do not salt foods before eating.  Try to eat at least 2 vegetarian meals each week.  Eat more home-cooked food and less restaurant, buffet, and fast food.  When eating at a restaurant, ask that your food be  prepared with less salt or no salt, if possible. What foods are recommended? The items listed may not be a complete list. Talk with your dietitian about what dietary choices are best for you. Grains Whole-grain or whole-wheat bread. Whole-grain or whole-wheat pasta. Bitton rice. [place]. Bulgur. Whole-grain and low-sodium cereals. Pita bread. Low-fat, low-sodium crackers. Whole-wheat flour tortillas. Vegetables Fresh or frozen vegetables (raw, steamed, roasted, or grilled). Low-sodium or reduced-sodium tomato and vegetable juice. Low-sodium or reduced-sodium tomato sauce and tomato  paste. Low-sodium or reduced-sodium canned vegetables. Fruits All fresh, dried, or frozen fruit. Canned fruit in natural juice (without added sugar). Meat and other protein foods Skinless chicken or [place]. Ground chicken or [place]. Pork with fat trimmed off. Fish and seafood. Egg whites. Dried beans, peas, or lentils. Unsalted nuts, nut butters, and seeds. Unsalted canned beans. Lean cuts of beef with fat trimmed off. Low-sodium, lean deli meat. Dairy Low-fat (1%) or fat-free (skim) milk. Fat-free, low-fat, or reduced-fat cheeses. Nonfat, low-sodium ricotta or cottage cheese. Low-fat or nonfat yogurt. Low-fat, low-sodium cheese. Fats and oils Soft margarine without trans fats. Vegetable oil. Low-fat, reduced-fat, or light mayonnaise and salad dressings (reduced-sodium). Canola, safflower, olive, soybean, and sunflower oils. Avocado. Seasoning and other foods Herbs. Spices. Seasoning mixes without salt. Unsalted popcorn and pretzels. Fat-free sweets. What foods are not recommended? The items listed may not be a complete list. Talk with your dietitian about what dietary choices are best for you. Grains Baked goods made with fat, such as croissants, muffins, or some breads. Dry pasta or rice meal packs. Vegetables Creamed or fried vegetables. Vegetables in a cheese sauce. Regular canned vegetables (not low-sodium or reduced-sodium). Regular canned tomato sauce and paste (not low-sodium or reduced-sodium). Regular tomato and vegetable juice (not low-sodium or reduced-sodium). Angie Fava. Olives. Fruits Canned fruit in a light or heavy syrup. Fried fruit. Fruit in cream or butter sauce. Meat and other protein foods Fatty cuts of meat. Ribs. Fried meat. Berniece Salines. Sausage. Bologna and other processed lunch meats. Salami. Fatback. Hotdogs. Bratwurst. Salted nuts and seeds. Canned beans with added salt. Canned or smoked fish. Whole eggs or egg yolks. Chicken or [place] with skin. Dairy Whole or 2% milk,  cream, and half-and-half. Whole or full-fat cream cheese. Whole-fat or sweetened yogurt. Full-fat cheese. Nondairy creamers. Whipped toppings. Processed cheese and cheese spreads. Fats and oils Butter. Stick margarine. Lard. Shortening. Ghee. Bacon fat. Tropical oils, such as coconut, palm kernel, or palm oil. Seasoning and other foods Salted popcorn and pretzels. Onion salt, garlic salt, seasoned salt, table salt, and sea salt. Worcestershire sauce. Tartar sauce. Barbecue sauce. Teriyaki sauce. Soy sauce, including reduced-sodium. Steak sauce. Canned and packaged gravies. Fish sauce. Oyster sauce. Cocktail sauce. Horseradish that you find on the shelf. Ketchup. Mustard. Meat flavorings and tenderizers. Bouillon cubes. Hot sauce and Tabasco sauce. Premade or packaged marinades. Premade or packaged taco seasonings. Relishes. Regular salad dressings. Where to find more information:  National Heart, Lung, and [place]: https://wilson-eaton.com/  American Heart Association: www.heart.org Summary  The DASH eating plan is a healthy eating plan that has been shown to reduce high blood pressure (hypertension). It may also reduce your risk for type 2 diabetes, heart disease, and stroke.  With the DASH eating plan, you should limit salt (sodium) intake to 2,300 mg a day. If you have hypertension, you may need to reduce your sodium intake to 1,500 mg a day.  When on the DASH eating plan, aim to eat more fresh fruits and vegetables, whole grains,  lean proteins, low-fat dairy, and heart-healthy fats.  Work with your health care provider or diet and nutrition specialist (dietitian) to adjust your eating plan to your individual calorie needs. This information is not intended to replace advice given to you by your health care provider. Make sure you discuss any questions you have with your health care provider. Document Released: 05/04/2011 Document Revised: 05/08/2016 Document Reviewed: 05/08/2016 Elsevier  Interactive Patient Education  2017 [employer]

## 2016-11-22 NOTE — Progress Notes (Signed)
Chief Complaint  Patient presents with  . Blood Pressure Check       New Patient Visit SUBJECTIVE: HPI: Kenneth Bullock is an 51 y.o.male who is being seen for establishing care.  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 He had recently consumed an energy drink. He does check his BP at home and it usually runs 130's/80's.  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  He has never been on BP medication. Patient has these side effects of medication: none He is usually adhering to a healthy diet overall. Exercise: plays basketball, walks dog  UA found trace protein and trace blood. He is not having any issues and denies seeing any blood.   No Known Allergies  Past Medical History:  Diagnosis Date  . Heart murmur    Echo- nml, didn't hear anymore  . Torn meniscus    right knee   Past Surgical History:  Procedure Laterality Date  . TOE SURGERY     Social History   Social History  . Marital status: Married   Social History Main Topics  . Smoking status: Never Smoker  . Smokeless tobacco: Never Used  . Alcohol use Yes     Comment: occasional  . Drug use: No   Family History  Problem Relation Age of Onset  . Cancer Mother   . Cancer Father    Takes no medications routinely.   ROS Cardiovascular: Denies chest pain  Respiratory: Denies dyspnea   OBJECTIVE: BP 132/82 (BP Location: Left Arm, Cuff Size: Normal)   Pulse 68   Temp 98.4 F (36.9 C) (Oral)   Resp 16   Ht 6\' 1"  (1.854 m)   Wt 216 lb 12.8 oz (98.3 kg)   SpO2 98%   BMI 28.60 kg/m   Constitutional: -  VS reviewed -  Well developed, well nourished, appears stated age -  No apparent distress  Psychiatric: -  Oriented to person, place, and time -  Memory intact -  Affect and mood normal -  Fluent conversation, good eye contact -  Judgment and insight age appropriate  Eye: -  Conjunctivae clear, no discharge -   Pupils symmetric, round, reactive to light; thin grey rim around periphery of iris b/l  ENMT: -  Oral mucosa without lesions, tongue and uvula midline    Tonsils not enlarged, no erythema, no exudate, trachea midline    Pharynx moist, no lesions, no erythema  Neck: -  No gross swelling, no palpable masses -  Thyroid midline, not enlarged, mobile, no palpable masses  Cardiovascular: -  RRR, no murmurs, no bruits -  No LE edema  Respiratory: -  Normal respiratory effort, no accessory muscle use, no retraction -  Breath sounds equal, no wheezes, no ronchi, no crackles  Gastrointestinal: -  Bowel sounds normal -  No tenderness, no distention, no guarding, no masses  Skin: -  No significant lesion on inspection -  Warm and dry to palpation   ASSESSMENT/PLAN: Elevated blood pressure reading  Proteinuria, unspecified type  Hematuria, unspecified type   BP OK today. Letter given at his request regarding this. Will obtain first void urine. Specimen cup given. 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  I would like to see his cholesterol.  Patient should return for CPE at earliest convenience.. The patient voiced understanding and agreement to the plan.   Jilda Rocheicholas Paul GrangerWendling, DO 11/22/16  2:14 PM

## 2016-11-22 NOTE — Addendum Note (Signed)
Addended by: Thelma BargeICHARDSON, Laquanda Bick D on: 11/22/2016 03:08 PM   Modules accepted: Orders

## 2016-11-23 LAB — URINALYSIS, ROUTINE W REFLEX MICROSCOPIC
Bilirubin Urine: NEGATIVE
Hgb urine dipstick: NEGATIVE
Ketones, ur: NEGATIVE
Leukocytes, UA: NEGATIVE
Nitrite: NEGATIVE
RBC / HPF: NONE SEEN (ref 0–?)
TOTAL PROTEIN, URINE-UPE24: NEGATIVE
URINE GLUCOSE: NEGATIVE
UROBILINOGEN UA: 0.2 (ref 0.0–1.0)
WBC UA: NONE SEEN (ref 0–?)
pH: 6 (ref 5.0–8.0)

## 2016-11-23 NOTE — Addendum Note (Signed)
Addended by: Eustace QuailEABOLD, Corvin Sorbo J on: 11/23/2016 08:59 AM   Modules accepted: Orders

## 2017-03-16 ENCOUNTER — Other Ambulatory Visit: Payer: Self-pay | Admitting: Student

## 2017-03-16 DIAGNOSIS — M25562 Pain in left knee: Secondary | ICD-10-CM

## 2017-03-16 DIAGNOSIS — M25462 Effusion, left knee: Secondary | ICD-10-CM

## 2017-06-01 ENCOUNTER — Other Ambulatory Visit: Payer: Self-pay | Admitting: Student

## 2017-06-01 DIAGNOSIS — M25562 Pain in left knee: Secondary | ICD-10-CM
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
## 2017-12-30 ENCOUNTER — Emergency Department (HOSPITAL_BASED_OUTPATIENT_CLINIC_OR_DEPARTMENT_OTHER): Payer: BLUE CROSS/BLUE SHIELD

## 2017-12-30 ENCOUNTER — Emergency Department (HOSPITAL_BASED_OUTPATIENT_CLINIC_OR_DEPARTMENT_OTHER)
Admission: EM | Admit: 2017-12-30 | Discharge: 2017-12-30 | Disposition: A | Discharge status: Home / Self Care | Payer: BLUE CROSS/BLUE SHIELD | Attending: Emergency Medicine | Admitting: Emergency Medicine

## 2017-12-30 ENCOUNTER — Encounter (HOSPITAL_BASED_OUTPATIENT_CLINIC_OR_DEPARTMENT_OTHER): Payer: Self-pay | Admitting: Emergency Medicine

## 2017-12-30 ENCOUNTER — Other Ambulatory Visit: Payer: Self-pay

## 2017-12-30 DIAGNOSIS — S199XXA Unspecified injury of neck, initial encounter: Secondary | ICD-10-CM | POA: Diagnosis not present

## 2017-12-30 DIAGNOSIS — Y929 Unspecified place or not applicable: Secondary | ICD-10-CM | POA: Diagnosis not present

## 2017-12-30 DIAGNOSIS — M545 Low back pain: Secondary | ICD-10-CM | POA: Insufficient documentation

## 2017-12-30 DIAGNOSIS — S161XXA Strain of muscle, fascia and tendon at neck level, initial encounter: Secondary | ICD-10-CM | POA: Diagnosis not present

## 2017-12-30 DIAGNOSIS — Y999 Unspecified external cause status: Secondary | ICD-10-CM | POA: Diagnosis not present

## 2017-12-30 DIAGNOSIS — T148XXA Other injury of unspecified body region, initial encounter: Secondary | ICD-10-CM

## 2017-12-30 DIAGNOSIS — Y939 Activity, unspecified: Secondary | ICD-10-CM | POA: Diagnosis not present

## 2017-12-30 DIAGNOSIS — S39012A Strain of muscle, fascia and tendon of lower back, initial encounter: Secondary | ICD-10-CM | POA: Diagnosis not present

## 2017-12-30 DIAGNOSIS — M542 Cervicalgia: Secondary | ICD-10-CM | POA: Diagnosis not present

## 2017-12-30 MED ORDER — METHOCARBAMOL 500 MG PO TABS: 500.0000 mg | ORAL_TABLET | Freq: Two times a day (BID) | ORAL | 0 refills | Status: AC

## 2017-12-30 MED ORDER — METHOCARBAMOL 500 MG PO TABS: 500.0000 mg | ORAL_TABLET | Freq: Two times a day (BID) | ORAL | 0 refills | Status: DC
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
## 2017-12-30 NOTE — Discharge Instructions (Signed)
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
## 2020-01-20 ENCOUNTER — Ambulatory Visit
Admission: EM | Admit: 2020-01-20 | Discharge: 2020-01-20 | Disposition: A | Discharge status: Home / Self Care | Payer: BC Managed Care – PPO | Attending: Urgent Care | Admitting: Urgent Care

## 2020-01-20 ENCOUNTER — Other Ambulatory Visit: Payer: Self-pay

## 2020-01-20 ENCOUNTER — Emergency Department: Payer: BC Managed Care – PPO

## 2020-01-20 ENCOUNTER — Emergency Department
Admission: EM | Admit: 2020-01-20 | Discharge: 2020-01-20 | Disposition: A | Discharge status: Home / Self Care | Payer: BC Managed Care – PPO | Attending: Emergency Medicine | Admitting: Emergency Medicine

## 2020-01-20 ENCOUNTER — Encounter: Payer: Self-pay | Admitting: *Deleted

## 2020-01-20 DIAGNOSIS — I10 Essential (primary) hypertension: Secondary | ICD-10-CM | POA: Insufficient documentation

## 2020-01-20 DIAGNOSIS — R9431 Abnormal electrocardiogram [ECG] [EKG]: Secondary | ICD-10-CM | POA: Diagnosis present

## 2020-01-20 DIAGNOSIS — R0789 Other chest pain: Secondary | ICD-10-CM

## 2020-01-20 DIAGNOSIS — R079 Chest pain, unspecified: Secondary | ICD-10-CM | POA: Diagnosis not present

## 2020-01-20 DIAGNOSIS — Z79899 Other long term (current) drug therapy: Secondary | ICD-10-CM | POA: Insufficient documentation

## 2020-01-20 LAB — FIBRIN DERIVATIVES D-DIMER (ARMC ONLY): Fibrin derivatives D-dimer (ARMC): 203.8 ng/mL (FEU) (ref 0.00–499.00)

## 2020-01-20 LAB — CBC
HCT: 44.2 % (ref 39.0–52.0)
Hemoglobin: 14.6 g/dL (ref 13.0–17.0)
MCH: 29 pg (ref 26.0–34.0)
MCHC: 33 g/dL (ref 30.0–36.0)
MCV: 87.7 fL (ref 80.0–100.0)
Platelets: 212 10*3/uL (ref 150–400)
RBC: 5.04 MIL/uL (ref 4.22–5.81)
RDW: 13.2 % (ref 11.5–15.5)
WBC: 4.1 10*3/uL (ref 4.0–10.5)
nRBC: 0 % (ref 0.0–0.2)

## 2020-01-20 LAB — BASIC METABOLIC PANEL
Anion gap: 7 (ref 5–15)
BUN: 12 mg/dL (ref 6–20)
CO2: 30 mmol/L (ref 22–32)
Calcium: 9.8 mg/dL (ref 8.9–10.3)
Chloride: 102 mmol/L (ref 98–111)
Creatinine, Ser: 1.03 mg/dL (ref 0.61–1.24)
GFR calc Af Amer: >60 mL/min (ref >60)
GFR calc non Af Amer: >60 mL/min (ref >60)
Glucose, Bld: 99 mg/dL (ref 70–99)
Potassium: 4.3 mmol/L (ref 3.5–5.1)
Sodium: 139 mmol/L (ref 135–145)

## 2020-01-20 LAB — TROPONIN I (HIGH SENSITIVITY): Troponin I (High Sensitivity): <2 ng/L (ref <18)

## 2020-01-20 MED ORDER — ASPIRIN 81 MG PO CHEW
324.0000 mg | CHEWABLE_TABLET | Freq: Once | ORAL | Status: AC
  Administered 2020-01-20: 14:00:00 324 mg via ORAL

## 2020-01-20 MED ORDER — KETOROLAC TROMETHAMINE 30 MG/ML IJ SOLN
30.0000 mg | Freq: Once | INTRAMUSCULAR | Status: AC
  Administered 2020-01-20: 30 mg via INTRAVENOUS
  Filled 2020-01-20: qty 1

## 2020-01-20 NOTE — ED Notes (Signed)
Visitor at bedside.

## 2020-01-20 NOTE — ED Triage Notes (Signed)
PT presents to UC with intermittent L sided CP over last month.  "Very faint, very light".  Started again 2 days ago while in bed but this time was a little worse than usual.  Reports it as a sharp pain.  Is reproducible with position and L chest wall TTP.  Denies SOB, dizziness, nausea or radiation to jaw/arm.   At rest pain is 5 and with positioning pain is 10.   Ibuprofen did not help.

## 2020-01-20 NOTE — Discharge Instructions (Signed)
Unfortunately your EKG is not normal and you have T wave inversion in lead III, aVF with possible ST elevation in lead V2 and V3.  We have discussed the possibility of you having a heart attack and management including transport to the emergency room by ambulance.  I understand that you do not want to go by ambulance but emphasized that you need to be evaluated in the emergency room setting to prove definitively you are not having a heart attack.  We have given you 324 mg of chewable aspirin, please report to the emergency room immediately.

## 2020-01-20 NOTE — ED Triage Notes (Signed)
Report called to [facility] ED.

## 2020-01-20 NOTE — ED Notes (Signed)
Pt reports he is into sports and states he is usually in "good shape". States has previously had episodes like this where he has chest discomfort while resting or being active. States previous times it was "real light" and felt like as if he "ate something and it got stuck". Denies "pain" with previous episodes but stated "it just didn't feel right". Skin dry. Resp reg/unlabored. Reports pain sometimes worse when he leans forward. Denies major medical history. Denies fever, BM/urination changes, and dizziness. Denies smoking, ETOH use, or drug use.
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
## 2020-01-20 NOTE — ED Triage Notes (Signed)
Patient is being discharged from the Urgent Care and sent to the Emergency Department via private vehicle (patient refused EMS) . Per Marlowe Shores, PA, patient is in need of higher level of care due to abnormal EKG. Patient is aware and verbalizes understanding of plan of care.  Vitals:   01/20/20 1336  BP: 135/89  Pulse: (!) 57  Resp: 18  Temp: 98.2 F (36.8 C)  SpO2: 97%

## 2020-01-20 NOTE — ED Notes (Signed)
Attempted for blue tube at L hand without success. Will have someone else try.

## 2020-01-20 NOTE — ED Triage Notes (Signed)
Pt went to the [facility] and was sent over. Per pt Provider at [facility] wanted to call EMS for transport here. He drove over. Pt reports having CP since 8/18. Has gotten progressively worse. Left pec area is where the pain is.

## 2021-01-10 ENCOUNTER — Telehealth: Payer: Self-pay

## 2022-08-28 DEATH — deceased

## 2023-10-24 NOTE — Telephone Encounter (Signed)
 Antonio Leonard
